# Patient Record
Sex: Male | Born: 1935 | Race: Black or African American | Hispanic: No | Smoking: Never smoker
Health system: Southern US, Community
[De-identification: ages and names within clinical notes are randomized; demographics above are authoritative.]

## PROBLEM LIST (undated history)

## (undated) DIAGNOSIS — I10 Essential (primary) hypertension: Secondary | ICD-10-CM

## (undated) DIAGNOSIS — E119 Type 2 diabetes mellitus without complications: Secondary | ICD-10-CM

## (undated) DIAGNOSIS — F0390 Unspecified dementia without behavioral disturbance: Secondary | ICD-10-CM

## (undated) HISTORY — PX: TOE AMPUTATION: SHX809

---

## 2015-09-03 ENCOUNTER — Emergency Department (HOSPITAL_BASED_OUTPATIENT_CLINIC_OR_DEPARTMENT_OTHER): Payer: Medicare Other

## 2015-09-03 ENCOUNTER — Encounter (HOSPITAL_BASED_OUTPATIENT_CLINIC_OR_DEPARTMENT_OTHER): Payer: Self-pay | Admitting: *Deleted

## 2015-09-03 ENCOUNTER — Inpatient Hospital Stay (HOSPITAL_BASED_OUTPATIENT_CLINIC_OR_DEPARTMENT_OTHER)
Admission: EM | Admit: 2015-09-03 | Discharge: 2015-09-07 | DRG: 683 | Disposition: A | Discharge status: Home / Self Care | Payer: Medicare Other | Attending: Internal Medicine | Admitting: Internal Medicine

## 2015-09-03 ENCOUNTER — Inpatient Hospital Stay (HOSPITAL_COMMUNITY): Payer: Medicare Other

## 2015-09-03 DIAGNOSIS — N3000 Acute cystitis without hematuria: Secondary | ICD-10-CM

## 2015-09-03 DIAGNOSIS — R748 Abnormal levels of other serum enzymes: Secondary | ICD-10-CM | POA: Diagnosis present

## 2015-09-03 DIAGNOSIS — F0390 Unspecified dementia without behavioral disturbance: Secondary | ICD-10-CM | POA: Diagnosis present

## 2015-09-03 DIAGNOSIS — E860 Dehydration: Secondary | ICD-10-CM | POA: Diagnosis present

## 2015-09-03 DIAGNOSIS — E1122 Type 2 diabetes mellitus with diabetic chronic kidney disease: Secondary | ICD-10-CM | POA: Diagnosis present

## 2015-09-03 DIAGNOSIS — F0391 Unspecified dementia with behavioral disturbance: Secondary | ICD-10-CM | POA: Diagnosis not present

## 2015-09-03 DIAGNOSIS — Z79899 Other long term (current) drug therapy: Secondary | ICD-10-CM

## 2015-09-03 DIAGNOSIS — Z794 Long term (current) use of insulin: Secondary | ICD-10-CM | POA: Diagnosis not present

## 2015-09-03 DIAGNOSIS — N390 Urinary tract infection, site not specified: Secondary | ICD-10-CM | POA: Diagnosis present

## 2015-09-03 DIAGNOSIS — N189 Chronic kidney disease, unspecified: Secondary | ICD-10-CM

## 2015-09-03 DIAGNOSIS — I129 Hypertensive chronic kidney disease with stage 1 through stage 4 chronic kidney disease, or unspecified chronic kidney disease: Secondary | ICD-10-CM | POA: Diagnosis present

## 2015-09-03 DIAGNOSIS — E119 Type 2 diabetes mellitus without complications: Secondary | ICD-10-CM

## 2015-09-03 DIAGNOSIS — N183 Chronic kidney disease, stage 3 unspecified: Secondary | ICD-10-CM | POA: Diagnosis present

## 2015-09-03 DIAGNOSIS — K859 Acute pancreatitis, unspecified: Secondary | ICD-10-CM

## 2015-09-03 DIAGNOSIS — I959 Hypotension, unspecified: Secondary | ICD-10-CM | POA: Diagnosis present

## 2015-09-03 DIAGNOSIS — N179 Acute kidney failure, unspecified: Principal | ICD-10-CM | POA: Diagnosis present

## 2015-09-03 DIAGNOSIS — I10 Essential (primary) hypertension: Secondary | ICD-10-CM | POA: Diagnosis present

## 2015-09-03 DIAGNOSIS — Z89429 Acquired absence of other toe(s), unspecified side: Secondary | ICD-10-CM | POA: Diagnosis not present

## 2015-09-03 HISTORY — DX: Essential (primary) hypertension: I10

## 2015-09-03 HISTORY — DX: Unspecified dementia, unspecified severity, without behavioral disturbance, psychotic disturbance, mood disturbance, and anxiety: F03.90

## 2015-09-03 HISTORY — DX: Type 2 diabetes mellitus without complications: E11.9

## 2015-09-03 LAB — URINALYSIS, ROUTINE W REFLEX MICROSCOPIC
Glucose, UA: NEGATIVE mg/dL
Ketones, ur: NEGATIVE mg/dL
Nitrite: NEGATIVE
PH: 5 (ref 5.0–8.0)
Protein, ur: NEGATIVE mg/dL
SPECIFIC GRAVITY, URINE: 1.021 (ref 1.005–1.030)

## 2015-09-03 LAB — COMPREHENSIVE METABOLIC PANEL
ALT: 21 U/L (ref 17–63)
AST: 31 U/L (ref 15–41)
Albumin: 3.7 g/dL (ref 3.5–5.0)
Alkaline Phosphatase: 74 U/L (ref 38–126)
Anion gap: 11 (ref 5–15)
BUN: 39 mg/dL — ABNORMAL HIGH (ref 6–20)
CHLORIDE: 101 mmol/L (ref 101–111)
CO2: 23 mmol/L (ref 22–32)
CREATININE: 3.13 mg/dL — AB (ref 0.61–1.24)
Calcium: 9.5 mg/dL (ref 8.9–10.3)
GFR calc non Af Amer: 16 mL/min — ABNORMAL LOW (ref >60)
GFR, EST AFRICAN AMERICAN: 19 mL/min — AB (ref >60)
Glucose, Bld: 261 mg/dL — ABNORMAL HIGH (ref 65–99)
Potassium: 3.5 mmol/L (ref 3.5–5.1)
SODIUM: 135 mmol/L (ref 135–145)
Total Bilirubin: 0.5 mg/dL (ref 0.3–1.2)
Total Protein: 7.4 g/dL (ref 6.5–8.1)

## 2015-09-03 LAB — I-STAT CG4 LACTIC ACID, ED
LACTIC ACID, VENOUS: 3.65 mmol/L — AB (ref 0.5–2.0)
Lactic Acid, Venous: 1.22 mmol/L (ref 0.5–2.0)

## 2015-09-03 LAB — CBC WITH DIFFERENTIAL/PLATELET
BASOS ABS: 0.1 10*3/uL (ref 0.0–0.1)
BASOS PCT: 1 %
EOS ABS: 0.2 10*3/uL (ref 0.0–0.7)
EOS PCT: 1 %
HCT: 41.4 % (ref 39.0–52.0)
Hemoglobin: 13.6 g/dL (ref 13.0–17.0)
Lymphocytes Relative: 19 %
Lymphs Abs: 2.2 10*3/uL (ref 0.7–4.0)
MCH: 29.4 pg (ref 26.0–34.0)
MCHC: 32.9 g/dL (ref 30.0–36.0)
MCV: 89.6 fL (ref 78.0–100.0)
Monocytes Absolute: 1 10*3/uL (ref 0.1–1.0)
Monocytes Relative: 9 %
Neutro Abs: 8 10*3/uL — ABNORMAL HIGH (ref 1.7–7.7)
Neutrophils Relative %: 70 %
PLATELETS: 208 10*3/uL (ref 150–400)
RBC: 4.62 MIL/uL (ref 4.22–5.81)
RDW: 13.3 % (ref 11.5–15.5)
WBC: 11.4 10*3/uL — AB (ref 4.0–10.5)

## 2015-09-03 LAB — OCCULT BLOOD X 1 CARD TO LAB, STOOL: Fecal Occult Bld: NEGATIVE

## 2015-09-03 LAB — URINE MICROSCOPIC-ADD ON

## 2015-09-03 LAB — LIPASE, BLOOD: Lipase: 198 U/L — ABNORMAL HIGH (ref 11–51)

## 2015-09-03 LAB — TROPONIN I
Troponin I: 0.11 ng/mL — ABNORMAL HIGH (ref <0.031)
Troponin I: 0.12 ng/mL — ABNORMAL HIGH (ref <0.031)

## 2015-09-03 LAB — CBG MONITORING, ED: Glucose-Capillary: 268 mg/dL — ABNORMAL HIGH (ref 65–99)

## 2015-09-03 LAB — BRAIN NATRIURETIC PEPTIDE: B Natriuretic Peptide: 32.9 pg/mL (ref 0.0–100.0)

## 2015-09-03 LAB — LACTATE DEHYDROGENASE: LDH: 193 U/L — AB (ref 98–192)

## 2015-09-03 MED ORDER — SODIUM CHLORIDE 0.9 % IV BOLUS (SEPSIS): 1000.0000 mL | Freq: Once | INTRAVENOUS | Status: DC

## 2015-09-03 MED ORDER — MAGNESIUM OXIDE 400 MG PO TABS: 400.0000 mg | ORAL_TABLET | Freq: Every day | ORAL | Status: DC

## 2015-09-03 MED ORDER — VANCOMYCIN HCL 10 G IV SOLR
20.0000 mg/kg | Freq: Once | INTRAVENOUS | Status: DC
  Filled 2015-09-03: qty 2576

## 2015-09-03 MED ORDER — GALANTAMINE HYDROBROMIDE ER 8 MG PO CP24: 16.0000 mg | ORAL_CAPSULE | Freq: Every day | ORAL | Status: DC

## 2015-09-03 MED ORDER — HEPARIN SODIUM (PORCINE) 5000 UNIT/ML IJ SOLN
5000.0000 [IU] | Freq: Three times a day (TID) | INTRAMUSCULAR | Status: DC
  Administered 2015-09-03 – 2015-09-07 (×11): 5000 [IU] via SUBCUTANEOUS
  Filled 2015-09-03 (×11): qty 1

## 2015-09-03 MED ORDER — VANCOMYCIN HCL 500 MG IV SOLR
INTRAVENOUS | Status: AC
  Filled 2015-09-03: qty 2000

## 2015-09-03 MED ORDER — INSULIN ASPART 100 UNIT/ML ~~LOC~~ SOLN
0.0000 [IU] | SUBCUTANEOUS | Status: DC
  Administered 2015-09-04: 2 [IU] via SUBCUTANEOUS
  Administered 2015-09-04: 3 [IU] via SUBCUTANEOUS
  Administered 2015-09-04: 2 [IU] via SUBCUTANEOUS

## 2015-09-03 MED ORDER — GALANTAMINE HYDROBROMIDE ER 8 MG PO CP24
16.0000 mg | ORAL_CAPSULE | Freq: Every day | ORAL | Status: DC
  Administered 2015-09-04 – 2015-09-07 (×4): 16 mg via ORAL
  Filled 2015-09-03 (×7): qty 2

## 2015-09-03 MED ORDER — MAGNESIUM OXIDE 400 (241.3 MG) MG PO TABS
400.0000 mg | ORAL_TABLET | Freq: Every day | ORAL | Status: DC
  Administered 2015-09-04 – 2015-09-07 (×4): 400 mg via ORAL
  Filled 2015-09-03 (×4): qty 1

## 2015-09-03 MED ORDER — SODIUM CHLORIDE 0.9 % IV BOLUS (SEPSIS)
1000.0000 mL | Freq: Once | INTRAVENOUS | Status: AC
  Administered 2015-09-03: 1000 mL via INTRAVENOUS

## 2015-09-03 MED ORDER — HYDRALAZINE HCL 20 MG/ML IJ SOLN: 10.0000 mg | INTRAMUSCULAR | Status: DC | PRN

## 2015-09-03 MED ORDER — INSULIN GLARGINE 100 UNIT/ML ~~LOC~~ SOLN
45.0000 [IU] | Freq: Every day | SUBCUTANEOUS | Status: DC
  Administered 2015-09-03 – 2015-09-06 (×4): 45 [IU] via SUBCUTANEOUS
  Filled 2015-09-03 (×5): qty 0.45

## 2015-09-03 MED ORDER — BENZTROPINE MESYLATE 1 MG PO TABS
1.0000 mg | ORAL_TABLET | Freq: Two times a day (BID) | ORAL | Status: DC
  Administered 2015-09-03 – 2015-09-07 (×8): 1 mg via ORAL
  Filled 2015-09-03 (×11): qty 1

## 2015-09-03 MED ORDER — CILOSTAZOL 100 MG PO TABS
100.0000 mg | ORAL_TABLET | Freq: Two times a day (BID) | ORAL | Status: DC
  Administered 2015-09-03 – 2015-09-07 (×8): 100 mg via ORAL
  Filled 2015-09-03 (×10): qty 1

## 2015-09-03 MED ORDER — HALOPERIDOL 0.5 MG PO TABS
0.5000 mg | ORAL_TABLET | Freq: Every evening | ORAL | Status: DC | PRN
  Administered 2015-09-04: 0.5 mg via ORAL
  Filled 2015-09-03 (×2): qty 1

## 2015-09-03 MED ORDER — PIPERACILLIN-TAZOBACTAM 3.375 G IVPB 30 MIN
3.3750 g | Freq: Once | INTRAVENOUS | Status: AC
  Administered 2015-09-03: 3.375 g via INTRAVENOUS
  Filled 2015-09-03 (×2): qty 50

## 2015-09-03 MED ORDER — SODIUM CHLORIDE 0.9 % IV SOLN
INTRAVENOUS | Status: DC
  Administered 2015-09-03 – 2015-09-06 (×2): via INTRAVENOUS

## 2015-09-03 MED ORDER — PANTOPRAZOLE SODIUM 40 MG IV SOLR
40.0000 mg | Freq: Once | INTRAVENOUS | Status: AC
  Administered 2015-09-03: 40 mg via INTRAVENOUS
  Filled 2015-09-03: qty 40

## 2015-09-03 MED ORDER — CITALOPRAM HYDROBROMIDE 20 MG PO TABS
20.0000 mg | ORAL_TABLET | Freq: Every day | ORAL | Status: DC
  Administered 2015-09-04 – 2015-09-07 (×4): 20 mg via ORAL
  Filled 2015-09-03 (×4): qty 1

## 2015-09-03 MED ORDER — SODIUM CHLORIDE 0.9 % IJ SOLN
3.0000 mL | Freq: Two times a day (BID) | INTRAMUSCULAR | Status: DC
  Administered 2015-09-03 – 2015-09-06 (×7): 3 mL via INTRAVENOUS

## 2015-09-03 MED ORDER — SODIUM CHLORIDE 0.9 % IV SOLN
2000.0000 mg | Freq: Once | INTRAVENOUS | Status: AC
  Administered 2015-09-03: 2000 mg via INTRAVENOUS
  Filled 2015-09-03: qty 2000

## 2015-09-03 MED ORDER — DEXTROSE 5 % IV SOLN
1.0000 g | INTRAVENOUS | Status: DC
  Administered 2015-09-03 – 2015-09-06 (×4): 1 g via INTRAVENOUS
  Filled 2015-09-03 (×6): qty 10

## 2015-09-03 MED ORDER — METOPROLOL TARTRATE 50 MG PO TABS
50.0000 mg | ORAL_TABLET | Freq: Two times a day (BID) | ORAL | Status: DC
  Administered 2015-09-03 – 2015-09-07 (×7): 50 mg via ORAL
  Filled 2015-09-03 (×8): qty 1

## 2015-09-03 MED ORDER — SODIUM CHLORIDE 0.45 % IV SOLN: INTRAVENOUS | Status: DC

## 2015-09-03 NOTE — Progress Notes (Signed)
[facility], presented with weakness, found to be dehydrated with renal failure, possible pancreatitis and a troponin leak. Per prelim discussions ED MD with family appears to be full code. Initially hypotensive with systolic into the 60s, responded to 1L bolus. Questionable melena but fecal occult negative  On patient's arrival, RN, please call [phone] and inform patient placement RN who will contact the next hospitalist on call who will do the admission.   Brian Camacho M. Elvera LennoxGherghe, MD [facility] [phone]

## 2015-09-03 NOTE — ED Notes (Signed)
Weakness and altered mental status. Dark stools. Not able to eat. Symptoms since Tuesday.

## 2015-09-03 NOTE — ED Notes (Signed)
Dr. Adela LankFloyd notified of pt's VS and to bedside to eval

## 2015-09-03 NOTE — ED Provider Notes (Signed)
CSN: [id number]     Arrival date & time 09/03/15  1435 History   First MD Initiated Contact with Patient 09/03/15 1501     Chief Complaint  Patient presents with  . Altered Mental Status  . Weakness     (Consider location/radiation/quality/duration/timing/severity/associated sxs/prior Treatment) Patient is a 81 y.o. male presenting with altered mental status and weakness.  Altered Mental Status Presenting symptoms: no confusion   Severity:  Mild Most recent episode:  2 days ago Episode history:  Continuous Duration:  4 days Timing:  Constant Progression:  Worsening Chronicity:  New Associated symptoms: nausea and weakness   Associated symptoms: no abdominal pain, no fever, no headaches, no palpitations, no rash and no vomiting   Weakness Pertinent negatives include no chest pain, no abdominal pain, no headaches and no shortness of breath.    81 yo M with a chief complaint of feeling weak. This been going on for about 4 days. Family is in is that he has had some dark and tarry stools with this. Feel like he's been mildly confused been having staring spells and slow to respond to conversation. Today patient was unable to get up to a standing position from a chair. Patient is normally able to transfer without significant difficulty. The normally does need help for this. Patient has been oriented to slow to respond. Received a flu shot about a week ago. Family denies any head injuries denies history of blood thinner use. Denies any abdominal pain diarrhea vomiting. Decreased oral intake for the past 4 days as well. Denies low back pain. Denies dysuria flank pain.  Past Medical History  Diagnosis Date  . Dementia   . Hypertension   . Diabetes mellitus without complication [facility](HCC)    Past Surgical History  Procedure Laterality Date  . Toe amputation     No family history on file. Social History  Substance Use Topics  . Smoking status: Never Smoker   . Smokeless tobacco: None  .  Alcohol Use: No    Review of Systems  Constitutional: Negative for fever and chills.  HENT: Negative for congestion and facial swelling.   Eyes: Negative for discharge and visual disturbance.  Respiratory: Negative for shortness of breath.   Cardiovascular: Negative for chest pain and palpitations.  Gastrointestinal: Positive for nausea, blood in stool (dark and tarry) and abdominal distention. Negative for vomiting, abdominal pain and diarrhea.  Musculoskeletal: Negative for myalgias and arthralgias.  Skin: Negative for color change and rash.  Neurological: Positive for weakness. Negative for tremors, syncope and headaches.  Psychiatric/Behavioral: Negative for confusion and dysphoric mood.      Allergies  Review of patient's allergies indicates no known allergies.  Home Medications   Prior to Admission medications   Medication Sig Start Date End Date Taking? Authorizing Provider  benztropine (COGENTIN) 1 MG tablet Take 1 mg by mouth 2 (two) times daily.   Yes Historical Provider, MD  cilostazol (PLETAL) 100 MG tablet Take 100 mg by mouth 2 (two) times daily.   Yes Historical Provider, MD  citalopram (CELEXA) 20 MG tablet Take 20 mg by mouth daily.   Yes Historical Provider, MD  galantamine (RAZADYNE ER) 16 MG 24 hr capsule Take 16 mg by mouth daily with breakfast.   Yes Historical Provider, MD  GLIPIZIDE PO Take by mouth.   Yes Historical Provider, MD  HYDROCHLOROTHIAZIDE PO Take by mouth.   Yes Historical Provider, MD  insulin glargine (LANTUS) 100 UNIT/ML injection Inject into the skin at bedtime.  Yes Historical Provider, MD  magnesium oxide (MAG-OX) 400 MG tablet Take 400 mg by mouth daily.   Yes Historical Provider, MD  metoprolol (LOPRESSOR) 50 MG tablet Take 50 mg by mouth 2 (two) times daily.   Yes Historical Provider, MD  Multiple Vitamins-Minerals (MULTIVITAMIN ADULT PO) Take by mouth.   Yes Historical Provider, MD  sitaGLIPtin (JANUVIA) 50 MG tablet Take 50 mg by  mouth daily.   Yes Historical Provider, MD  valsartan (DIOVAN) 80 MG tablet Take 80 mg by mouth daily.   Yes Historical Provider, MD   BP 130/85 mmHg  Pulse 79  Temp(Src) 99.3 F (37.4 C) (Rectal)  Resp 19  Ht  (1.88 m)  Wt 284 lb (128.822 kg)  BMI 36.45 kg/m2  SpO2 95% Physical Exam  Constitutional: He is oriented to person, place, and time. He appears well-developed and well-nourished.  HENT:  Head: Normocephalic and atraumatic.  Eyes: EOM are normal. Pupils are equal, round, and reactive to light.  Neck: Normal range of motion. Neck supple. No JVD present.  Cardiovascular: Normal rate and regular rhythm.  Exam reveals no gallop and no friction rub.   No murmur heard. Pulmonary/Chest: No respiratory distress. He has no wheezes.  Abdominal: He exhibits distension. There is no tenderness. There is no rebound and no guarding.  Tympany on percussion  Musculoskeletal: Normal range of motion.  Chronic skin changes to the legs suggesting peripheral vascular disease  4/5 muscle strength to the bilateral lower extremities. Equal.  Neurological: He is alert and oriented to person, place, and time.  Skin: No rash noted. No pallor.  Psychiatric: He has a normal mood and affect. His behavior is normal.  Nursing note and vitals reviewed.   ED Course  Procedures (including critical care time) Labs Review Labs Reviewed  CBC WITH DIFFERENTIAL/PLATELET - Abnormal; Notable for the following:    WBC 11.4 (*)    Neutro Abs 8.0 (*)    All other components within normal limits  COMPREHENSIVE METABOLIC PANEL - Abnormal; Notable for the following:    Glucose, Bld 261 (*)    BUN 39 (*)    Creatinine, Ser 3.13 (*)    GFR calc non Af Amer 16 (*)    GFR calc Af Amer 19 (*)    All other components within normal limits  TROPONIN I - Abnormal; Notable for the following:    Troponin I 0.11 (*)    All other components within normal limits  LIPASE, BLOOD - Abnormal; Notable for the following:     Lipase 198 (*)    All other components within normal limits  CBG MONITORING, ED - Abnormal; Notable for the following:    Glucose-Capillary 268 (*)    All other components within normal limits  I-STAT CG4 LACTIC ACID, ED - Abnormal; Notable for the following:    Lactic Acid, Venous 3.65 (*)    All other components within normal limits  CULTURE, BLOOD (ROUTINE X 2)  CULTURE, BLOOD (ROUTINE X 2)  OCCULT BLOOD X 1 CARD TO LAB, STOOL  BRAIN NATRIURETIC PEPTIDE  URINALYSIS, ROUTINE W REFLEX MICROSCOPIC (NOT AT [facility])  LACTATE DEHYDROGENASE    Imaging Review Ct Abdomen Pelvis Wo Contrast  09/03/2015  CLINICAL DATA:  Altered mental status since Tuesday. Failure to thrive, lethargic, weakness. Hx dementia, HTN, DM Pancreatitis, abd distention. EXAM: CT ABDOMEN AND PELVIS WITHOUT CONTRAST TECHNIQUE: Multidetector CT imaging of the abdomen and pelvis was performed following the standard protocol without IV contrast. COMPARISON:  05/27/2003 FINDINGS: Lower chest: There is bibasilar atelectasis at the lung bases. Coronary artery disease is present. Upper abdomen: No focal abnormality identified within the liver, spleen, pancreas, adrenal glands. Bilateral renal cysts are present. No hydronephrosis or ureteral obstruction. The gallbladder is present. Gastrointestinal tract: The stomach and small bowel loops are normal in appearance. The appendix is well seen and has a normal appearance. Colonic loops have numerous diverticula but there is no evidence for acute diverticulitis. Pelvis: Urinary bladder, prostate glands have a normal appearance. No free pelvic fluid. Retroperitoneum: Atherosclerotic calcification of the abdominal aorta. No aneurysm. No retroperitoneal or mesenteric adenopathy. Abdominal wall: Unremarkable. Osseous structures: Scoliosis. Degenerative changes in the thoracolumbar spine. No suspicious lytic or blastic lesions are identified. IMPRESSION: 1.  No evidence for acute  abnormality. 2.  Coronary artery disease. 3. Bibasilar atelectasis. 4. Bilateral renal cysts. 5. Colonic diverticulosis. 6. Normal appendix. 7. Atherosclerosis of the abdominal aorta. 8. Scoliosis and degenerative disc disease. Electronically Signed   By: Norva Pavlov M.D.   On: 09/03/2015 17:05   Ct Head Wo Contrast  09/03/2015  CLINICAL DATA:  Altered mental status. Failure to thrive. Lethargy. Weakness. Dementia. EXAM: CT HEAD WITHOUT CONTRAST TECHNIQUE: Contiguous axial images were obtained from the base of the skull through the vertex without intravenous contrast. COMPARISON:  08/14/2005 head CT. FINDINGS: The study is mildly motion degraded No evidence of parenchymal hemorrhage or extra-axial fluid collection. Mild prominence of the extra-axial space in the bifrontal convexities is favored to be secondary to cerebral atrophy. No mass lesion, mass effect, or midline shift. No CT evidence of acute infarction. Intracranial atherosclerosis Diffuse cerebral volume loss. Cerebral ventricle sizes are stable and concordant with the degree of cerebral volume loss. 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 The visualized paranasal sinuses are otherwise essentially clear. The mastoid air cells are unopacified. No evidence of calvarial fracture. IMPRESSION: 1. Mildly motion degraded study. No evidence of acute intracranial abnormality. 2. Intracranial atherosclerosis and diffuse cerebral volume loss. Electronically Signed   By: Delbert Phenix M.D.   On: 09/03/2015 17:00   Dg Chest Port 1 View  09/03/2015  CLINICAL DATA:  Cough, slurred speech, lethargic since Tuesday. EXAM: PORTABLE CHEST 1 VIEW COMPARISON:  Previous exams including chest x-ray dated 03/16/2015. FINDINGS: Study is hypoinspiratory. Given the low lung volumes, lungs appear clear. No evidence of pneumonia or pleural effusion. Cardiomediastinal silhouette appears stable in size and configuration. Osseous and soft tissue structures about the chest are unremarkable.  IMPRESSION: No evidence of acute cardiopulmonary abnormality. Electronically Signed   By: Bary Richard M.D.   On: 09/03/2015 15:49   I have personally reviewed and evaluated these images and lab results as part of my medical decision-making.   EKG Interpretation   Date/Time:  Friday September 03 2015 14:52:44 EST Ventricular Rate:  81 PR Interval:  138 QRS Duration: 118 QT Interval:  414 QTC Calculation: 480 R Axis:   -54 Text Interpretation:  Normal sinus rhythm Left axis deviation Left  ventricular hypertrophy with QRS widening ST \\T \ T wave abnormality,  consider lateral ischemia No prior EKG for comparison Confirmed by LIU MD,  Annabelle Harman ([id number]) on 09/03/2015 3:04:29 PM      MDM   Final diagnoses:  Acute pancreatitis, unspecified pancreatitis type    81 yo M with a chief complaints of weakness. This been going on for the past 4 days. No noted infectious complaints. Some nausea concern for dark and tarry stools. Patient with a heart rate of  90 while on beta blockers as well as having of blood pressure in the 60s on my exam. Concern with dark tarry stools of possibility of acute  GI bleed. Rectal exam performed with greenish stool no noted dark and tarry no active bleeding. No noted abdominal tenderness. Initial lactate 3.6. Will obtain a CT of the head CT abdomen pelvis chest x-ray urine CBC CMP lipase troponin. Initially give the patient liter of fluids and Protonix.  Patient had significant improvement of blood pressure was just 1 L of fluids. Blood pressure now in the 130s systolic with maps in the 90s. Patient's lipase concerning at 198. Suspect that patient's symptoms are all related to pancreatitis. CT of the abdomen and pelvis performed without contrast due to acute kidney injury. Patient was found to have no significant intra-abdominal pathology on that. CT the head was negative as well. His troponin was positive at 0.11. Patient with no EKG changes. Not currently having chest pain  or shortness of breath. Question whether this is demand ischemia secondary to hypovolemia. LDH added on for Ranson's criteria.  CRITICAL CARE Performed by: Rae Roam   Total critical care time: 75 minutes  Critical care time was exclusive of separately billable procedures and treating other patients.  Critical care was necessary to treat or prevent imminent or life-threatening deterioration.  Critical care was time spent personally by me on the following activities: development of treatment plan with patient and/or surrogate as well as nursing, discussions with consultants, evaluation of patient's response to treatment, examination of patient, obtaining history from patient or surrogate, ordering and performing treatments and interventions, ordering and review of laboratory studies, ordering and review of radiographic studies, pulse oximetry and re-evaluation of patient's condition.  The patients results and plan were reviewed and discussed.   Any x-rays performed were independently reviewed by myself.   Differential diagnosis were considered with the presenting HPI.  Medications  vancomycin (VANCOCIN) 2,000 mg in sodium chloride 0.9 % 500 mL IVPB (2,000 mg Intravenous New Bag/Given 09/03/15 1618)  0.45 % sodium chloride infusion (not administered)  sodium chloride 0.9 % bolus 1,000 mL (0 mLs Intravenous Stopped 09/03/15 1612)  pantoprazole (PROTONIX) injection 40 mg (40 mg Intravenous Given 09/03/15 1621)  piperacillin-tazobactam (ZOSYN) IVPB 3.375 g (3.375 g Intravenous New Bag/Given 09/03/15 1528)  vancomycin (VANCOCIN) 500 MG powder (  Duplicate 09/03/15 1655)    Filed Vitals:   09/03/15 1516 09/03/15 1530 09/03/15 1600 09/03/15 1700  BP: 81/60 111/71 124/78 130/85  Pulse: 73  77 79  Temp:      TempSrc:      Resp: 19 18 21 19   Height:      Weight:      SpO2: 93%  97% 95%    Final diagnoses:  Acute pancreatitis, unspecified pancreatitis type    Admission/  observation were discussed with the admitting physician, patient and/or family and they are comfortable with the plan.     Melene Plan, DO 09/03/15 [phone]

## 2015-09-03 NOTE — ED Notes (Signed)
Patient transported to CT and returned at this time 

## 2015-09-03 NOTE — H&P (Signed)
[facility] History and Physical  Edenilson Austad WNU:[id number] DOB: April 23, 1923 DOA: 09/03/2015  Referring physician: EDP PCP: Pcp Not In System   Chief Complaint: Generalized Weakness   HPI: Rocky Gladden is a 81 y.o. male with Dementia, who presents to the ED at [facility] with generalized weakness.  Per daughter this has been ongoing and worsening for the past 4 days.  Patient has also been "pocketing" his food (holding it in his mouth without swallowing) which is new.  Family also feels like he has been more confused, staring spells.  No abd pain, no fever, no chills, no headache, no rash, no vomiting, no diarrhea.  Review of Systems: no pain anywhere, no dysuria, but not clear how useful historical information is from the patient as he dosent seem to remember even being in the ED at [facility] earlier this evening. Systems reviewed.  As above, otherwise negative  Past Medical History  Diagnosis Date  . Dementia   . Hypertension   . Diabetes mellitus without complication [facility])    Past Surgical History  Procedure Laterality Date  . Toe amputation     Social History:  reports that he has never smoked. He does not have any smokeless tobacco history on file. He reports that he does not drink alcohol or use illicit drugs.  No Known Allergies  No family history on file.   Prior to Admission medications   Medication Sig Start Date End Date Taking? Authorizing Provider  benztropine (COGENTIN) 1 MG tablet Take 1 mg by mouth 2 (two) times daily.   Yes Historical Provider, MD  cilostazol (PLETAL) 100 MG tablet Take 100 mg by mouth 2 (two) times daily.   Yes Historical Provider, MD  citalopram (CELEXA) 20 MG tablet Take 20 mg by mouth daily.   Yes Historical Provider, MD  galantamine (RAZADYNE ER) 16 MG 24 hr capsule Take 16 mg by mouth daily with breakfast.   Yes Historical Provider, MD  GLIPIZIDE PO Take by mouth.   Yes Historical Provider, MD  HYDROCHLOROTHIAZIDE PO Take by mouth.   Yes  Historical Provider, MD  insulin glargine (LANTUS) 100 UNIT/ML injection Inject into the skin at bedtime.   Yes Historical Provider, MD  magnesium oxide (MAG-OX) 400 MG tablet Take 400 mg by mouth daily.   Yes Historical Provider, MD  metoprolol (LOPRESSOR) 50 MG tablet Take 50 mg by mouth 2 (two) times daily.   Yes Historical Provider, MD  Multiple Vitamins-Minerals (MULTIVITAMIN ADULT PO) Take by mouth.   Yes Historical Provider, MD  sitaGLIPtin (JANUVIA) 50 MG tablet Take 50 mg by mouth daily.   Yes Historical Provider, MD  valsartan (DIOVAN) 80 MG tablet Take 80 mg by mouth daily.   Yes Historical Provider, MD   Physical Exam: Filed Vitals:   09/03/15 1930 09/03/15 2003  BP: 169/93 163/99  Pulse: 80 90  Temp:  97.6 F (36.4 C)  Resp: 20 21    BP 163/99 mmHg  Pulse 90  Temp(Src) 97.6 F (36.4 C) (Oral)  Resp 21  Ht  (1.88 m)  Wt 128.3 kg (282 lb 13.6 oz)  BMI 36.30 kg/m2  SpO2 92%  General Appearance:    Alert, demented, no distress, appears stated age  Head:    Normocephalic, atraumatic  Eyes:    PERRL, EOMI, sclera non-icteric        Nose:   Nares without drainage or epistaxis. Mucosa, turbinates normal  Throat:   Moist mucous membranes. Oropharynx without erythema or exudate.  Neck:   Supple. No carotid bruits.  No thyromegaly.  No lymphadenopathy.   Back:     No CVA tenderness, no spinal tenderness  Lungs:     Clear to auscultation bilaterally, without wheezes, rhonchi or rales  Chest wall:    No tenderness to palpitation  Heart:    Regular rate and rhythm without murmurs, gallops, rubs  Abdomen:     Soft, non-tender, Distended, normal bowel sounds, no organomegaly  Genitalia:    deferred  Rectal:    deferred  Extremities:   No clubbing, cyanosis or edema.  Pulses:   2+ and symmetric all extremities  Skin:   Skin color, texture, turgor normal, no rashes or lesions  Lymph nodes:   Cervical, supraclavicular, and axillary nodes normal  Neurologic:   CNII-XII  intact. Normal strength, sensation and reflexes      throughout    Labs on Admission:  Basic Metabolic Panel:  Recent Labs Lab 09/03/15 1500  NA 135  K 3.5  CL 101  CO2 23  GLUCOSE 261*  BUN 39*  CREATININE 3.13*  CALCIUM 9.5   Liver Function Tests:  Recent Labs Lab 09/03/15 1500  AST 31  ALT 21  ALKPHOS 74  BILITOT 0.5  PROT 7.4  ALBUMIN 3.7    Recent Labs Lab 09/03/15 1500  LIPASE 198*   No results for input(s): AMMONIA in the last 168 hours. CBC:  Recent Labs Lab 09/03/15 1500  WBC 11.4*  NEUTROABS 8.0*  HGB 13.6  HCT 41.4  MCV 89.6  PLT 208   Cardiac Enzymes:  Recent Labs Lab 09/03/15 1500  TROPONINI 0.11*    BNP (last 3 results) No results for input(s): PROBNP in the last 8760 hours. CBG:  Recent Labs Lab 09/03/15 1453  GLUCAP 268*    Radiological Exams on Admission: Ct Abdomen Pelvis Wo Contrast  09/03/2015  CLINICAL DATA:  Altered mental status since Tuesday. Failure to thrive, lethargic, weakness. Hx dementia, HTN, DM Pancreatitis, abd distention. EXAM: CT ABDOMEN AND PELVIS WITHOUT CONTRAST TECHNIQUE: Multidetector CT imaging of the abdomen and pelvis was performed following the standard protocol without IV contrast. COMPARISON:  05/27/2003 FINDINGS: Lower chest: There is bibasilar atelectasis at the lung bases. Coronary artery disease is present. Upper abdomen: No focal abnormality identified within the liver, spleen, pancreas, adrenal glands. Bilateral renal cysts are present. No hydronephrosis or ureteral obstruction. The gallbladder is present. Gastrointestinal tract: The stomach and small bowel loops are normal in appearance. The appendix is well seen and has a normal appearance. Colonic loops have numerous diverticula but there is no evidence for acute diverticulitis. Pelvis: Urinary bladder, prostate glands have a normal appearance. No free pelvic fluid. Retroperitoneum: Atherosclerotic calcification of the abdominal aorta. No  aneurysm. No retroperitoneal or mesenteric adenopathy. Abdominal wall: Unremarkable. Osseous structures: Scoliosis. Degenerative changes in the thoracolumbar spine. No suspicious lytic or blastic lesions are identified. IMPRESSION: 1.  No evidence for acute  abnormality. 2. Coronary artery disease. 3. Bibasilar atelectasis. 4. Bilateral renal cysts. 5. Colonic diverticulosis. 6. Normal appendix. 7. Atherosclerosis of the abdominal aorta. 8. Scoliosis and degenerative disc disease. Electronically Signed   By: Norva PavlovElizabeth  Brown M.D.   On: 09/03/2015 17:05   Ct Head Wo Contrast  09/03/2015  CLINICAL DATA:  Altered mental status. Failure to thrive. Lethargy. Weakness. Dementia. EXAM: CT HEAD WITHOUT CONTRAST TECHNIQUE: Contiguous axial images were obtained from the base of the skull through the vertex without intravenous contrast. COMPARISON:  08/14/2005 head CT. FINDINGS: The study  is mildly motion degraded No evidence of parenchymal hemorrhage or extra-axial fluid collection. Mild prominence of the extra-axial space in the bifrontal convexities is favored to be secondary to cerebral atrophy. No mass lesion, mass effect, or midline shift. No CT evidence of acute infarction. Intracranial atherosclerosis Diffuse cerebral volume loss. Cerebral ventricle sizes are stable and concordant with the degree of cerebral volume loss. 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 The visualized paranasal sinuses are otherwise essentially clear. The mastoid air cells are unopacified. No evidence of calvarial fracture. IMPRESSION: 1. Mildly motion degraded study. No evidence of acute intracranial abnormality. 2. Intracranial atherosclerosis and diffuse cerebral volume loss. Electronically Signed   By: Delbert Phenix M.D.   On: 09/03/2015 17:00   Dg Chest Port 1 View  09/03/2015  CLINICAL DATA:  Cough, slurred speech, lethargic since Tuesday. EXAM: PORTABLE CHEST 1 VIEW COMPARISON:  Previous exams including chest x-ray dated  03/16/2015. FINDINGS: Study is hypoinspiratory. Given the low lung volumes, lungs appear clear. No evidence of pneumonia or pleural effusion. Cardiomediastinal silhouette appears stable in size and configuration. Osseous and soft tissue structures about the chest are unremarkable. IMPRESSION: No evidence of acute cardiopulmonary abnormality. Electronically Signed   By: Bary Richard M.D.   On: 09/03/2015 15:49    EKG: Independently reviewed.  Assessment/Plan Principal Problem:   Acute kidney injury superimposed on chronic kidney disease (HCC) Active Problems:   Elevated lipase   CKD (chronic kidney disease) stage 3, GFR 30-59 ml/min   UTI (urinary tract infection)   DM2 (diabetes mellitus, type 2) (HCC)   HTN (hypertension)   Dementia   1. AKI on CKD stage 3 - 1. Creatinine was 1.6 in June at [facility] this year, now up to 3 2. Bladder scan ordered to r/o retention 3. US renal ordered 4. Dosent really have a pre-renal pattern per say, but certianly has a pre-renal history of decreased PO intake, also had lactate of 3 that cleared to 1 with IVF. 5. Continue IVF for hydration 6. Hold ARB 7. Hold diuretic 2. Elevated lipase - 1. Mild elevation to 190 in patient with AKI 2. No abd pain or TTP 3. No CT scan findings for pancreatitis 4. On IVF as above, but doubt that this represents acute pancreatitis 3. UTI - 1. Culture pending, possible UTI with large leukocyte esterase and 6-30 WBC in urine 2. Empiric rocephin 4. DM2 - 1. Reduce lantus to 50 units QHS from 70 he takes at home while NPO 2. SSI q4h 5. Dementia - with decreased PO intake 1. Concern for swallowing given the pocketing of food in mouth: 2. SLP consult ordered 3. NPO except sips with meds cautiously for now until SLP clears 4. Continue home cogentin, home haldol 0.5mg  QHS prn if needed. 6. HTN - 1. Hold ARB, hold HCTZ 2. Continue metoprolol 3. Adding PRN hydralazine if needed    Code Status: Full code for now per  daughter  Family Communication: No family at bedside Disposition Plan: Admit to inpatient   Time spent: 70 min  GARDNER, JARED M. [facility] Pager [phone]  If 7AM-7PM, please contact the day team taking care of the patient [email or web address] Password [facility] 09/03/2015, 10:49 PM

## 2015-09-04 DIAGNOSIS — N189 Chronic kidney disease, unspecified: Secondary | ICD-10-CM

## 2015-09-04 DIAGNOSIS — F0390 Unspecified dementia without behavioral disturbance: Secondary | ICD-10-CM

## 2015-09-04 DIAGNOSIS — R748 Abnormal levels of other serum enzymes: Secondary | ICD-10-CM

## 2015-09-04 DIAGNOSIS — N179 Acute kidney failure, unspecified: Principal | ICD-10-CM

## 2015-09-04 DIAGNOSIS — N390 Urinary tract infection, site not specified: Secondary | ICD-10-CM

## 2015-09-04 LAB — GLUCOSE, CAPILLARY
GLUCOSE-CAPILLARY: 129 mg/dL — AB (ref 65–99)
Glucose-Capillary: 199 mg/dL — ABNORMAL HIGH (ref 65–99)
Glucose-Capillary: 135 mg/dL — ABNORMAL HIGH (ref 65–99)
Glucose-Capillary: 85 mg/dL (ref 65–99)
Glucose-Capillary: 181 mg/dL — ABNORMAL HIGH (ref 65–99)
Glucose-Capillary: 129 mg/dL — ABNORMAL HIGH (ref 65–99)

## 2015-09-04 LAB — BASIC METABOLIC PANEL
Anion gap: 9 (ref 5–15)
BUN: 31 mg/dL — AB (ref 6–20)
CHLORIDE: 106 mmol/L (ref 101–111)
CO2: 24 mmol/L (ref 22–32)
CREATININE: 2.34 mg/dL — AB (ref 0.61–1.24)
Calcium: 9 mg/dL (ref 8.9–10.3)
GFR calc Af Amer: 27 mL/min — ABNORMAL LOW (ref >60)
GFR calc non Af Amer: 23 mL/min — ABNORMAL LOW (ref >60)
Glucose, Bld: 103 mg/dL — ABNORMAL HIGH (ref 65–99)
Potassium: 3.3 mmol/L — ABNORMAL LOW (ref 3.5–5.1)
Sodium: 139 mmol/L (ref 135–145)

## 2015-09-04 LAB — TROPONIN I
TROPONIN I: 0.11 ng/mL — AB (ref <0.031)
Troponin I: 0.11 ng/mL — ABNORMAL HIGH (ref <0.031)

## 2015-09-04 LAB — MRSA PCR SCREENING: MRSA by PCR: NEGATIVE

## 2015-09-04 MED ORDER — INSULIN ASPART 100 UNIT/ML ~~LOC~~ SOLN
0.0000 [IU] | Freq: Every day | SUBCUTANEOUS | Status: DC
  Administered 2015-09-05: 2 [IU] via SUBCUTANEOUS

## 2015-09-04 MED ORDER — INSULIN ASPART 100 UNIT/ML ~~LOC~~ SOLN: 0.0000 [IU] | Freq: Three times a day (TID) | SUBCUTANEOUS | Status: DC

## 2015-09-04 MED ORDER — INSULIN ASPART 100 UNIT/ML ~~LOC~~ SOLN
0.0000 [IU] | Freq: Three times a day (TID) | SUBCUTANEOUS | Status: DC
  Administered 2015-09-04: 3 [IU] via SUBCUTANEOUS
  Administered 2015-09-05 (×2): 5 [IU] via SUBCUTANEOUS
  Administered 2015-09-06: 3 [IU] via SUBCUTANEOUS
  Administered 2015-09-06: 2 [IU] via SUBCUTANEOUS

## 2015-09-04 NOTE — Progress Notes (Signed)
[facility] Progress Note   Brian Camacho  ZOX:[id number]  DOB: 1923/01/09  DOA: 09/03/2015 PCP: Pcp Not In System  Brief narrative: Brian Camacho is a 81 y.o. male with dementia and recently with poor PO intake at home and pocketing of food per family who presents with generalized weakness and confusion and is found to have AKI.    Subjective: Alert, confused to place. No complaints of nausea, vomiting, dyspnea, cough, abdominal pain or diarrhea.   Assessment/Plan: Principal Problem:   Acute kidney injury superimposed on chronic kidney disease/CKD (chronic kidney disease) stage 3, GFR 30-59 ml/min - suspected to be prerenal  Active Problems:   Elevated lipase - no nausea, vomiting or abdominal pain    UTI (urinary tract infection) - mildly positive UA- blood cultures negative- cont Rocephin    HTN (hypertension) - currently hypotensive- antihypertensives on hold    Dementia - confused to place, time and situation - has passed SLP eval - can have D2 with thin liquids- follow PO intake   Code Status:     Code Status Orders        Start     Ordered   09/03/15 2248  Full code   Continuous     09/03/15 2248    Advance Directive Documentation        Most Recent Value   Type of Advance Directive  Healthcare Power of Attorney, Living will   Pre-existing out of facility DNR order (yellow form or pink MOST form)     "MOST" Form in Place?       Family Communication:   Disposition Plan: follow in SDU DVT prophylaxis: heparin Consultants: Procedures:   Antibiotics: Anti-infectives    Start     Dose/Rate Route Frequency Ordered Stop   09/03/15 2300  cefTRIAXone (ROCEPHIN) 1 g in dextrose 5 % 50 mL IVPB     1 g 100 mL/hr over 30 Minutes Intravenous Every 24 hours 09/03/15 2232     09/03/15 1601  vancomycin (VANCOCIN) 500 MG powder    Comments:  Benton, Joss   : cabinet override      09/03/15 1601 09/03/15 1655   09/03/15 1545  vancomycin (VANCOCIN) 2,000 mg  in sodium chloride 0.9 % 500 mL IVPB     2,000 mg 250 mL/hr over 120 Minutes Intravenous  Once 09/03/15 1543 09/03/15 1901   09/03/15 1530  vancomycin (VANCOCIN) 2,576 mg in sodium chloride 0.9 % 500 mL IVPB  Status:  Discontinued     20 mg/kg  128.8 kg 250 mL/hr over 120 Minutes Intravenous  Once 09/03/15 1515 09/03/15 1543   09/03/15 1530  piperacillin-tazobactam (ZOSYN) IVPB 3.375 g     3.375 g 100 mL/hr over 30 Minutes Intravenous  Once 09/03/15 1515 09/03/15 1702      Objective: Filed Weights   09/03/15 1442 09/03/15 2003  Weight: 128.822 kg (284 lb) 128.3 kg (282 lb 13.6 oz)    Intake/Output Summary (Last 24 hours) at 09/04/15 1516 Last data filed at 09/04/15 1500  Gross per 24 hour  Intake 3562.5 ml  Output    375 ml  Net 3187.5 ml     Vitals Filed Vitals:   09/04/15 0700 09/04/15 1000 09/04/15 1100 09/04/15 1249  BP:  94/64  103/64  Pulse:    79  Temp: 97.7 F (36.5 C)  98.1 F (36.7 C)   TempSrc: Oral  Oral   Resp:    16  Height:      Weight:  SpO2:    93%    Exam:  General:  Pt is alert, not in acute distress  HEENT: No icterus, No thrush, oral mucosa moist  Cardiovascular: regular rate and rhythm, S1/S2 No murmur  Respiratory: clear to auscultation bilaterally   Abdomen: Soft, +Bowel sounds, non tender, non distended, no guarding  MSK: No LE edema, cyanosis or clubbing  Data Reviewed: Basic Metabolic Panel:  Recent Labs Lab 09/03/15 1500 09/04/15 0410  NA 135 139  K 3.5 3.3*  CL 101 106  CO2 23 24  GLUCOSE 261* 103*  BUN 39* 31*  CREATININE 3.13* 2.34*  CALCIUM 9.5 9.0   Liver Function Tests:  Recent Labs Lab 09/03/15 1500  AST 31  ALT 21  ALKPHOS 74  BILITOT 0.5  PROT 7.4  ALBUMIN 3.7    Recent Labs Lab 09/03/15 1500  LIPASE 198*   No results for input(s): AMMONIA in the last 168 hours. CBC:  Recent Labs Lab 09/03/15 1500  WBC 11.4*  NEUTROABS 8.0*  HGB 13.6  HCT 41.4  MCV 89.6  PLT 208   Cardiac  Enzymes:  Recent Labs Lab 09/03/15 1500 09/03/15 2224 09/04/15 0410 09/04/15 0930  TROPONINI 0.11* 0.12* 0.11* 0.11*   BNP (last 3 results)  Recent Labs  09/03/15 1500  BNP 32.9    ProBNP (last 3 results) No results for input(s): PROBNP in the last 8760 hours.  CBG:  Recent Labs Lab 09/03/15 1453 09/04/15 0055 09/04/15 0304 09/04/15 0737  GLUCAP 268* 199* 135* 85    Recent Results (from the past 240 hour(s))  Blood culture (routine x 2)     Status: None (Preliminary result)   Collection Time: 09/03/15  3:00 PM  Result Value Ref Range Status   Specimen Description BLOOD RIGHT ANTECUBITAL  Final   Special Requests BOTTLES DRAWN AEROBIC AND ANAEROBIC 5CC EACH  Final   Culture   Final    NO GROWTH < 24 HOURS Performed at [facility]    Report Status PENDING  Incomplete  Blood culture (routine x 2)     Status: None (Preliminary result)   Collection Time: 09/03/15  3:10 PM  Result Value Ref Range Status   Specimen Description BLOOD RIGHT FOREARM  Final   Special Requests BOTTLES DRAWN AEROBIC ONLY 5CC  Final   Culture   Final    NO GROWTH < 24 HOURS Performed at [facility]    Report Status PENDING  Incomplete  MRSA PCR Screening     Status: None   Collection Time: 09/03/15 10:37 PM  Result Value Ref Range Status   MRSA by PCR NEGATIVE NEGATIVE Final    Comment:        The GeneXpert MRSA Assay (FDA approved for NASAL specimens only), is one component of a comprehensive MRSA colonization surveillance program. It is not intended to diagnose MRSA infection nor to guide or monitor treatment for MRSA infections.      Studies: Ct Abdomen Pelvis Wo Contrast  09/03/2015  CLINICAL DATA:  Altered mental status since Tuesday. Failure to thrive, lethargic, weakness. Hx dementia, HTN, DM Pancreatitis, abd distention. EXAM: CT ABDOMEN AND PELVIS WITHOUT CONTRAST TECHNIQUE: Multidetector CT imaging of the abdomen and pelvis was performed following  the standard protocol without IV contrast. COMPARISON:  05/27/2003 FINDINGS: Lower chest: There is bibasilar atelectasis at the lung bases. Coronary artery disease is present. Upper abdomen: No focal abnormality identified within the liver, spleen, pancreas, adrenal glands. Bilateral renal cysts are present. No  hydronephrosis or ureteral obstruction. The gallbladder is present. Gastrointestinal tract: The stomach and small bowel loops are normal in appearance. The appendix is well seen and has a normal appearance. Colonic loops have numerous diverticula but there is no evidence for acute diverticulitis. Pelvis: Urinary bladder, prostate glands have a normal appearance. No free pelvic fluid. Retroperitoneum: Atherosclerotic calcification of the abdominal aorta. No aneurysm. No retroperitoneal or mesenteric adenopathy. Abdominal wall: Unremarkable. Osseous structures: Scoliosis. Degenerative changes in the thoracolumbar spine. No suspicious lytic or blastic lesions are identified. IMPRESSION: 1.  No evidence for acute  abnormality. 2. Coronary artery disease. 3. Bibasilar atelectasis. 4. Bilateral renal cysts. 5. Colonic diverticulosis. 6. Normal appendix. 7. Atherosclerosis of the abdominal aorta. 8. Scoliosis and degenerative disc disease. Electronically Signed   By: Norva Pavlov M.D.   On: 09/03/2015 17:05   Ct Head Wo Contrast  09/03/2015  CLINICAL DATA:  Altered mental status. Failure to thrive. Lethargy. Weakness. Dementia. EXAM: CT HEAD WITHOUT CONTRAST TECHNIQUE: Contiguous axial images were obtained from the base of the skull through the vertex without intravenous contrast. COMPARISON:  08/14/2005 head CT. FINDINGS: The study is mildly motion degraded No evidence of parenchymal hemorrhage or extra-axial fluid collection. Mild prominence of the extra-axial space in the bifrontal convexities is favored to be secondary to cerebral atrophy. No mass lesion, mass effect, or midline shift. No CT evidence of  acute infarction. Intracranial atherosclerosis Diffuse cerebral volume loss. Cerebral ventricle sizes are stable and concordant with the degree of cerebral volume loss. 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 The visualized paranasal sinuses are otherwise essentially clear. The mastoid air cells are unopacified. No evidence of calvarial fracture. IMPRESSION: 1. Mildly motion degraded study. No evidence of acute intracranial abnormality. 2. Intracranial atherosclerosis and diffuse cerebral volume loss. Electronically Signed   By: Delbert Phenix M.D.   On: 09/03/2015 17:00   US Renal Port  09/04/2015  CLINICAL DATA:  81 year old male with acute renal insufficiency. EXAM: RENAL / URINARY TRACT ULTRASOUND COMPLETE COMPARISON:  CT dated 09/03/2015 FINDINGS: Right Kidney: Length: 10.7 cm. The right kidney is mildly atrophic and echogenic. There is a 1.9 x 2.0 x 1.9 cm interpolar cyst. There is no hydronephrosis or echogenic stone. Left Kidney: Length: 12.9 cm. There is moderate cortical thinning with mild increased echogenicity. No hydronephrosis or echogenic stone. Bladder: Appears normal for degree of bladder distention. IMPRESSION: Right renal cyst. Bilateral renal cortical thinning with mild increased echogenicity. No hydronephrosis or or echogenic stone. Electronically Signed   By: Elgie Collard M.D.   On: 09/04/2015 02:45   Dg Chest Port 1 View  09/03/2015  CLINICAL DATA:  Cough, slurred speech, lethargic since Tuesday. EXAM: PORTABLE CHEST 1 VIEW COMPARISON:  Previous exams including chest x-ray dated 03/16/2015. FINDINGS: Study is hypoinspiratory. Given the low lung volumes, lungs appear clear. No evidence of pneumonia or pleural effusion. Cardiomediastinal silhouette appears stable in size and configuration. Osseous and soft tissue structures about the chest are unremarkable. IMPRESSION: No evidence of acute cardiopulmonary abnormality. Electronically Signed   By: Bary Richard M.D.   On: 09/03/2015  15:49    Scheduled Meds:  Scheduled Meds: . benztropine  1 mg Oral BID  . cefTRIAXone (ROCEPHIN)  IV  1 g Intravenous Q24H  . cilostazol  100 mg Oral BID  . citalopram  20 mg Oral Daily  . galantamine  16 mg Oral Q breakfast  . heparin  5,000 Units Subcutaneous 3 times per day  . insulin aspart  0-15  Units Subcutaneous 6 times per day  . insulin glargine  45 Units Subcutaneous QHS  . magnesium oxide  400 mg Oral Daily  . metoprolol  50 mg Oral BID  . sodium chloride  3 mL Intravenous Q12H   Continuous Infusions: . sodium chloride 125 mL/hr at 09/04/15 1500    Time spent on care of this patient: 35 min   Raye Wiens, MD 09/04/2015, 3:16 PM  LOS: 1 day   [facility] Office  [phone] Pager - Text Page per [email or web address] If 7PM-7AM, please contact night-coverage [email or web address]

## 2015-09-04 NOTE — Evaluation (Signed)
Clinical/Bedside Swallow Evaluation Patient Details  Name: Brian Camacho MRN: [id number] Date of Birth: 1923-04-22  Today's Date: 09/04/2015 Time: SLP Start Time (ACUTE ONLY): 0859 SLP Stop Time (ACUTE ONLY): 0919 SLP Time Calculation (min) (ACUTE ONLY): 20 min  Past Medical History:  Past Medical History  Diagnosis Date  . Dementia   . Hypertension   . Diabetes mellitus without complication [facility](HCC)    Past Surgical History:  Past Surgical History  Procedure Laterality Date  . Toe amputation     HPI:  Brian NoraJames Barabas is a 81 y.o. male with Dementia, who [facility] with generalized weakness. Per daughter this has been ongoing and worsening for the past 4 days. Patient has also been "pocketing" his food (holding it in his mouth without swallowing) which is new. Family also feels like he has been more confused, staring spells. No abd pain, no fever, no chills, no headache, no rash, no vomiting, no diarrhea.   Assessment / Plan / Recommendation Clinical Impression  Clinical swallowing evaluation was completed. Family had noted new onset of oral pocketing of foods.  Oral mech exam did not reveal any overt issues.  Lingula and labial ROM and strength were adequate.  The patient presented with mild oral dysphagia characterized by delayed oral transit that was most likely impacted by his lack of dentition.  Swallow trigger was timely and hyo-laryngeal excursion appeared to be adequate.  No overt s/s of aspiration were noted.  Recommend a dysphagia 2 diet with thin liquids.  ST to follow up for therapeutic diet tolerance.     Aspiration Risk  No limitations    Diet Recommendation   Dysphagia 2 with thin liquids.    Medication Administration: Whole meds with liquid    Other  Recommendations Oral Care Recommendations: Oral care BID   Follow up Recommendations  None    Frequency and Duration min 1 x/week  2 weeks       Prognosis Prognosis for Safe Diet Advancement: Fair       Swallow Study   General Date of Onset: 09/03/15 HPI: Brian NoraJames Yarde is a 81 y.o. male with Dementia, who [facility] with generalized weakness. Per daughter this has been ongoing and worsening for the past 4 days. Patient has also been "pocketing" his food (holding it in his mouth without swallowing) which is new. Family also feels like he has been more confused, staring spells. No abd pain, no fever, no chills, no headache, no rash, no vomiting, no diarrhea. Type of Study: Bedside Swallow Evaluation Previous Swallow Assessment: None noted. Diet Prior to this Study: NPO Temperature Spikes Noted: Yes Respiratory Status: Room air History of Recent Intubation: No Behavior/Cognition: Alert;Cooperative;Pleasant mood;Requires cueing Oral Cavity Assessment: Dry Oral Care Completed by SLP: No Oral Cavity - Dentition: Edentulous Vision: Functional for self-feeding Self-Feeding Abilities: Needs assist Patient Positioning: Upright in bed Baseline Vocal Quality: Normal Volitional Cough: Strong Volitional Swallow: Able to elicit    Oral/Motor/Sensory Function Overall Oral Motor/Sensory Function: Within functional limits   Ice Chips Ice chips: Not tested   Thin Liquid Thin Liquid: Within functional limits Presentation: Cup;Spoon;Straw    Nectar Thick Nectar Thick Liquid: Not tested   Honey Thick Honey Thick Liquid: Not tested   Puree Puree: Impaired Presentation: Spoon Oral Phase Impairments: Reduced lingual movement/coordination Oral Phase Functional Implications: Prolonged oral transit   Solid Solid: Impaired Presentation: Spoon Oral Phase Impairments: Impaired mastication Oral Phase Functional Implications: Prolonged oral transit  Dimas Aguas, MA, CCC-SLP Acute Rehab SLP [phone]  Dimas Aguas N 09/04/2015,9:26 AM

## 2015-09-05 DIAGNOSIS — F0391 Unspecified dementia with behavioral disturbance: Secondary | ICD-10-CM

## 2015-09-05 DIAGNOSIS — I10 Essential (primary) hypertension: Secondary | ICD-10-CM

## 2015-09-05 DIAGNOSIS — E119 Type 2 diabetes mellitus without complications: Secondary | ICD-10-CM

## 2015-09-05 LAB — BASIC METABOLIC PANEL
ANION GAP: 5 (ref 5–15)
BUN: 23 mg/dL — AB (ref 6–20)
CALCIUM: 8.7 mg/dL — AB (ref 8.9–10.3)
CHLORIDE: 109 mmol/L (ref 101–111)
CO2: 24 mmol/L (ref 22–32)
CREATININE: 1.76 mg/dL — AB (ref 0.61–1.24)
GFR calc non Af Amer: 32 mL/min — ABNORMAL LOW (ref >60)
GFR, EST AFRICAN AMERICAN: 37 mL/min — AB (ref >60)
Glucose, Bld: 91 mg/dL (ref 65–99)
Potassium: 4.4 mmol/L (ref 3.5–5.1)
SODIUM: 138 mmol/L (ref 135–145)

## 2015-09-05 LAB — GLUCOSE, CAPILLARY
GLUCOSE-CAPILLARY: 90 mg/dL (ref 65–99)
GLUCOSE-CAPILLARY: 211 mg/dL — AB (ref 65–99)
Glucose-Capillary: 203 mg/dL — ABNORMAL HIGH (ref 65–99)

## 2015-09-05 LAB — URINE CULTURE: Culture: NO GROWTH

## 2015-09-05 MED ORDER — RISPERIDONE 1 MG/ML PO SOLN
1.0000 mg | Freq: Every day | ORAL | Status: DC
  Filled 2015-09-05 (×2): qty 1

## 2015-09-05 MED ORDER — HALOPERIDOL 0.5 MG PO TABS
0.5000 mg | ORAL_TABLET | Freq: Every day | ORAL | Status: DC
  Administered 2015-09-05 – 2015-09-06 (×2): 0.5 mg via ORAL
  Filled 2015-09-05 (×3): qty 1

## 2015-09-05 NOTE — Progress Notes (Signed)
Patient to transfer to 6E16 report given to receiving nurse all questions answered at this time.  Pt. VSS with no s/s of distress noted.  Patient stable at transfer.

## 2015-09-05 NOTE — Progress Notes (Signed)
[facility] Progress Note   Per Beagley  WGN:[id number]  DOB: 14-Dec-1922  DOA: 09/03/2015 PCP: Pcp Not In System  Brief narrative: Brian Camacho is a 81 y.o. male with dementia and recently with poor PO intake at home and pocketing of food per family who presents with generalized weakness and confusion and is found to have AKI.    Subjective: Alert,  RN, he is eating well as long as he is encouraged. Needs to be prompted to drink water and this prevents him from pocketing his food. The patient has no complaints.   Assessment/Plan: Principal Problem:   Acute kidney injury superimposed on chronic kidney disease/CKD (chronic kidney disease) stage 3, GFR 30-59 ml/min - suspected to be prerenal- improving- decrease IVF rate  Active Problems:   Elevated lipase - no nausea, vomiting or abdominal pain    UTI (urinary tract infection) - mildly positive UA- cont Rocephin - blood cultures negative- Urine culture negative thus far    HTN (hypertension) - currently hypotensive- antihypertensives on hold    Dementia - confused to place, time and situation  Pocketing of food - has passed SLP eval - tolerating D2 with thin liquids but needs quite a bit of prompting and encouragement when eating   Code Status:     Code Status Orders        Start     Ordered   09/03/15 2248  Full code   Continuous     09/03/15 2248    Advance Directive Documentation        Most Recent Value   Type of Advance Directive  Healthcare Power of Attorney, Living will   Pre-existing out of facility DNR order (yellow form or pink MOST form)     "MOST" Form in Place?       Family Communication:   Disposition Plan:tx to med surg floor DVT prophylaxis: heparin Consultants: Procedures:   Antibiotics: Anti-infectives    Start     Dose/Rate Route Frequency Ordered Stop   09/03/15 2300  cefTRIAXone (ROCEPHIN) 1 g in dextrose 5 % 50 mL IVPB     1 g 100 mL/hr over 30 Minutes Intravenous Every 24  hours 09/03/15 2232     09/03/15 1601  vancomycin (VANCOCIN) 500 MG powder    Comments:  Benton, Joss   : cabinet override      09/03/15 1601 09/03/15 1655   09/03/15 1545  vancomycin (VANCOCIN) 2,000 mg in sodium chloride 0.9 % 500 mL IVPB     2,000 mg 250 mL/hr over 120 Minutes Intravenous  Once 09/03/15 1543 09/03/15 1901   09/03/15 1530  vancomycin (VANCOCIN) 2,576 mg in sodium chloride 0.9 % 500 mL IVPB  Status:  Discontinued     20 mg/kg  128.8 kg 250 mL/hr over 120 Minutes Intravenous  Once 09/03/15 1515 09/03/15 1543   09/03/15 1530  piperacillin-tazobactam (ZOSYN) IVPB 3.375 g     3.375 g 100 mL/hr over 30 Minutes Intravenous  Once 09/03/15 1515 09/03/15 1702      Objective: Filed Weights   09/03/15 1442 09/03/15 2003  Weight: 128.822 kg (284 lb) 128.3 kg (282 lb 13.6 oz)    Intake/Output Summary (Last 24 hours) at 09/05/15 1550 Last data filed at 09/05/15 1418  Gross per 24 hour  Intake   2780 ml  Output   1176 ml  Net   1604 ml     Vitals Filed Vitals:   09/05/15 0305 09/05/15 0821 09/05/15 1314 09/05/15 1520  BP: 128/77  142/83 127/75 122/65  Pulse: 66 66 66 68  Temp: 98.1 F (36.7 C) 98.3 F (36.8 C) 97.5 F (36.4 C) 97.8 F (36.6 C)  TempSrc: Axillary Oral Oral Oral  Resp: 16 23 19 18   Height:      Weight:      SpO2: 97% 95% 99% 98%    Exam:  General:  Brian Camacho is alert, confused to place and time, not in acute distress  HEENT: No icterus, No thrush, oral mucosa moist  Cardiovascular: regular rate and rhythm, S1/S2 No murmur  Respiratory: clear to auscultation bilaterally   Abdomen: Soft, +Bowel sounds, non tender, non distended, no guarding  MSK: No LE edema, cyanosis or clubbing  Data Reviewed: Basic Metabolic Panel:  Recent Labs Lab 09/03/15 1500 09/04/15 0410 09/05/15 0750  NA 135 139 138  K 3.5 3.3* 4.4  CL 101 106 109  CO2 23 24 24   GLUCOSE 261* 103* 91  BUN 39* 31* 23*  CREATININE 3.13* 2.34* 1.76*  CALCIUM 9.5 9.0 8.7*    Liver Function Tests:  Recent Labs Lab 09/03/15 1500  AST 31  ALT 21  ALKPHOS 74  BILITOT 0.5  PROT 7.4  ALBUMIN 3.7    Recent Labs Lab 09/03/15 1500  LIPASE 198*   No results for input(s): AMMONIA in the last 168 hours. CBC:  Recent Labs Lab 09/03/15 1500  WBC 11.4*  NEUTROABS 8.0*  HGB 13.6  HCT 41.4  MCV 89.6  PLT 208   Cardiac Enzymes:  Recent Labs Lab 09/03/15 1500 09/03/15 2224 09/04/15 0410 09/04/15 0930  TROPONINI 0.11* 0.12* 0.11* 0.11*   BNP (last 3 results)  Recent Labs  09/03/15 1500  BNP 32.9    ProBNP (last 3 results) No results for input(s): PROBNP in the last 8760 hours.  CBG:  Recent Labs Lab 09/04/15 1250 09/04/15 1551 09/04/15 2156 09/05/15 0840 09/05/15 1313  GLUCAP 129* 181* 129* 90 203*    Recent Results (from the past 240 hour(s))  Blood culture (routine x 2)     Status: None (Preliminary result)   Collection Time: 09/03/15  3:00 PM  Result Value Ref Range Status   Specimen Description BLOOD RIGHT ANTECUBITAL  Final   Special Requests BOTTLES DRAWN AEROBIC AND ANAEROBIC 5CC EACH  Final   Culture   Final    NO GROWTH 2 DAYS Performed at [facility]    Report Status PENDING  Incomplete  Blood culture (routine x 2)     Status: None (Preliminary result)   Collection Time: 09/03/15  3:10 PM  Result Value Ref Range Status   Specimen Description BLOOD RIGHT FOREARM  Final   Special Requests BOTTLES DRAWN AEROBIC ONLY 5CC  Final   Culture   Final    NO GROWTH 2 DAYS Performed at [facility]    Report Status PENDING  Incomplete  MRSA PCR Screening     Status: None   Collection Time: 09/03/15 10:37 PM  Result Value Ref Range Status   MRSA by PCR NEGATIVE NEGATIVE Final    Comment:        The GeneXpert MRSA Assay (FDA approved for NASAL specimens only), is one component of a comprehensive MRSA colonization surveillance program. It is not intended to diagnose MRSA infection nor to guide  or monitor treatment for MRSA infections.   Culture, Urine     Status: None   Collection Time: 09/04/15 12:24 AM  Result Value Ref Range Status   Specimen Description URINE, RANDOM  Final   Special Requests NONE  Final   Culture NO GROWTH 1 DAY  Final   Report Status 09/05/2015 FINAL  Final     Studies: Ct Abdomen Pelvis Wo Contrast  09/03/2015  CLINICAL DATA:  Altered mental status since Tuesday. Failure to thrive, lethargic, weakness. Hx dementia, HTN, DM Pancreatitis, abd distention. EXAM: CT ABDOMEN AND PELVIS WITHOUT CONTRAST TECHNIQUE: Multidetector CT imaging of the abdomen and pelvis was performed following the standard protocol without IV contrast. COMPARISON:  05/27/2003 FINDINGS: Lower chest: There is bibasilar atelectasis at the lung bases. Coronary artery disease is present. Upper abdomen: No focal abnormality identified within the liver, spleen, pancreas, adrenal glands. Bilateral renal cysts are present. No hydronephrosis or ureteral obstruction. The gallbladder is present. Gastrointestinal tract: The stomach and small bowel loops are normal in appearance. The appendix is well seen and has a normal appearance. Colonic loops have numerous diverticula but there is no evidence for acute diverticulitis. Pelvis: Urinary bladder, prostate glands have a normal appearance. No free pelvic fluid. Retroperitoneum: Atherosclerotic calcification of the abdominal aorta. No aneurysm. No retroperitoneal or mesenteric adenopathy. Abdominal wall: Unremarkable. Osseous structures: Scoliosis. Degenerative changes in the thoracolumbar spine. No suspicious lytic or blastic lesions are identified. IMPRESSION: 1.  No evidence for acute  abnormality. 2. Coronary artery disease. 3. Bibasilar atelectasis. 4. Bilateral renal cysts. 5. Colonic diverticulosis. 6. Normal appendix. 7. Atherosclerosis of the abdominal aorta. 8. Scoliosis and degenerative disc disease. Electronically Signed   By: Norva PavlovElizabeth  Brown M.D.    On: 09/03/2015 17:05   Ct Head Wo Contrast  09/03/2015  CLINICAL DATA:  Altered mental status. Failure to thrive. Lethargy. Weakness. Dementia. EXAM: CT HEAD WITHOUT CONTRAST TECHNIQUE: Contiguous axial images were obtained from the base of the skull through the vertex without intravenous contrast. COMPARISON:  08/14/2005 head CT. FINDINGS: The study is mildly motion degraded No evidence of parenchymal hemorrhage or extra-axial fluid collection. Mild prominence of the extra-axial space in the bifrontal convexities is favored to be secondary to cerebral atrophy. No mass lesion, mass effect, or midline shift. No CT evidence of acute infarction. Intracranial atherosclerosis Diffuse cerebral volume loss. Cerebral ventricle sizes are stable and concordant with the degree of cerebral volume loss. 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 The visualized paranasal sinuses are otherwise essentially clear. The mastoid air cells are unopacified. No evidence of calvarial fracture. IMPRESSION: 1. Mildly motion degraded study. No evidence of acute intracranial abnormality. 2. Intracranial atherosclerosis and diffuse cerebral volume loss. Electronically Signed   By: Delbert PhenixJason A Poff M.D.   On: 09/03/2015 17:00   [place] Renal Port  09/04/2015  CLINICAL DATA:  81 year old male with acute renal insufficiency. EXAM: RENAL / URINARY TRACT ULTRASOUND COMPLETE COMPARISON:  CT dated 09/03/2015 FINDINGS: Right Kidney: Length: 10.7 cm. The right kidney is mildly atrophic and echogenic. There is a 1.9 x 2.0 x 1.9 cm interpolar cyst. There is no hydronephrosis or echogenic stone. Left Kidney: Length: 12.9 cm. There is moderate cortical thinning with mild increased echogenicity. No hydronephrosis or echogenic stone. Bladder: Appears normal for degree of bladder distention. IMPRESSION: Right renal cyst. Bilateral renal cortical thinning with mild increased echogenicity. No hydronephrosis or or echogenic stone. Electronically Signed   By: Elgie CollardArash   Radparvar M.D.   On: 09/04/2015 02:45    Scheduled Meds:  Scheduled Meds: . benztropine  1 mg Oral BID  . cefTRIAXone (ROCEPHIN)  IV  1 g Intravenous Q24H  . cilostazol  100 mg Oral BID  . citalopram  20 mg Oral Daily  . galantamine  16 mg Oral Q breakfast  . heparin  5,000 Units Subcutaneous 3 times per day  . insulin aspart  0-15 Units Subcutaneous TID WC  . insulin aspart  0-5 Units Subcutaneous QHS  . insulin glargine  45 Units Subcutaneous QHS  . magnesium oxide  400 mg Oral Daily  . metoprolol  50 mg Oral BID  . risperiDONE  1 mg Oral QHS  . sodium chloride  3 mL Intravenous Q12H   Continuous Infusions: . sodium chloride 75 mL/hr at 09/05/15 1100    Time spent on care of this patient: 35 min   Zelda Reames, MD 09/05/2015, 3:50 PM  LOS: 2 days   [facility] Office  [phone] Pager - Text Page per [email or web address] If 7PM-7AM, please contact night-coverage [email or web address]

## 2015-09-05 NOTE — Progress Notes (Signed)
Transfer note:  Arrival Method: Bed from 3S Mental Orientation: A&OX1 Telemetry: 6E16 CCMD notified Assessment: See flowsheet Skin: Dry;intact IV: R A/C; NS 1175ml/hr Pain: Denies Tubes: Condom cath Safety Measures: bed in lowest position, nonskid socks placed, bed alarm activated, placed in camera room near nursing station  6700 Orientation: Patient has been oriented to the unit, staff and to the room.   Orders have been reviewed and implemented. Will continue to assess and monitor pt.  Lujean AmelKadeesha Kenyetta Wimbish,RN

## 2015-09-05 NOTE — Evaluation (Signed)
Physical Therapy Evaluation Patient Details Name: Brian Camacho MRN: [id number] DOB: 14-Aug-1923 Today's Date: 09/05/2015   History of Present Illness  Brian NoraJames Lavis is a 81 y.o. male with Dementia, who [facility].  Dx: AKI.  Pt's PMH includes dementia,   Clinical Impression  Pt admitted with above diagnosis. Pt currently with functional limitations due to the deficits listed below (see PT Problem List). Per RN's discussion w/ pt's daughter he has WC at home and is able to transfer to chair Ind.  Unclear pt's living situation and PLOF.  Pt will need 24/7 supervision/assist at d/c and therefore recommending SNF, unless family is prepared and able to provide 24/7 assist at home.  Pt required minA for lateral transfer from bed to drop arm chair. Pt will benefit from skilled PT to increase their independence and safety with mobility to allow discharge to the venue listed below.      Follow Up Recommendations SNF;Supervision/Assistance - 24 hour    Equipment Recommendations  None recommended by PT    Recommendations for Other Services       Precautions / Restrictions Precautions Precautions: Fall Restrictions Weight Bearing Restrictions: No      Mobility  Bed Mobility Overal bed mobility: Needs Assistance Bed Mobility: Supine to Sit     Supine to sit: Min guard;HOB elevated     General bed mobility comments: Close min guard, pt requires increased time but responds well to verbal cues.  HOB elevated.  Transfers Overall transfer level: Needs assistance Equipment used: None Transfers: Lateral/Scoot Transfers          Lateral/Scoot Transfers: Min assist General transfer comment: Min A blocking knees during scoot from bed to drop arm recliner chair.  Cues to scoot back as pt demonstrates posterior lean causing his hips to tend to slide forward.  Pt pushes through Bil UEs and LEs to achieve transfer.  Attempted sit>stand initially w/ +2 A but  was unsuccessful 2/2 pt's posterior lean   Ambulation/Gait                Stairs            Wheelchair Mobility    Modified Rankin (Stroke Patients Only)       Balance Overall balance assessment: Needs assistance Sitting-balance support: Feet supported Sitting balance-Leahy Scale: Fair Sitting balance - Comments: Posterior lean and requires cues to scoot back on bed at times Postural control: Posterior lean                                   Pertinent Vitals/Pain Pain Assessment: No/denies pain Faces Pain Scale: No hurt    Home Living Family/patient expects to be discharged to:: Private residence Living Arrangements: Children Available Help at Discharge: Family             Additional Comments: Information received from RN who had spoken to pt's daughter earlier.  Pt unable to provide information.  Attmempted to call daughter, [name], with no response.    Prior Function Level of Independence: Needs assistance   Gait / Transfers Assistance Needed: [facility]o assist [facility]TA            Hand Dominance        Extremity/Trunk Assessment   Upper Extremity Assessment: Overall WFL for tasks assessed           Lower Extremity  Assessment: Generalized weakness;RLE deficits/detail;LLE deficits/detail RLE Deficits / Details: tightness Bil hamstrings noted LLE Deficits / Details: tightness Bil hamstrings noted  Cervical / Trunk Assessment: Kyphotic  Communication   Communication: HOH  Cognition Arousal/Alertness: Awake/alert Behavior During Therapy: WFL for tasks assessed/performed Overall Cognitive Status: History of cognitive impairments - at baseline                      General Comments      Exercises General Exercises - Upper Extremity Shoulder Flexion: AROM;Both;10 reps;Seated General Exercises - Lower Extremity Long Arc Quad: AROM;Both;20 reps;Seated      Assessment/Plan    PT Assessment  Patient needs continued PT services  PT Diagnosis Difficulty walking;Generalized weakness   PT Problem List Decreased strength;Decreased range of motion;Decreased activity tolerance;Decreased balance;Decreased mobility;Decreased cognition;Decreased knowledge of use of DME;Decreased safety awareness;Decreased knowledge of precautions  PT Treatment Interventions DME instruction;Gait training;Functional mobility training;Therapeutic activities;Therapeutic exercise;Balance training;Neuromuscular re-education;Cognitive remediation;Patient/family education;Wheelchair mobility training   PT Goals (Current goals can be found in the Care Plan section) Acute Rehab PT Goals Patient Stated Goal: none stated PT Goal Formulation: Patient unable to participate in goal setting Time For Goal Achievement: 09/19/15 Potential to Achieve Goals: Good    Frequency Min 2X/week   Barriers to discharge   Unclear home environment as no family present    Co-evaluation               End of Session Equipment Utilized During Treatment: Gait belt Activity Tolerance: Patient tolerated treatment well Patient left: in chair;with call bell/phone within reach;with chair alarm set Nurse Communication: Mobility status;Other (comment) (technique to return to bed or W/C once appropriate)         Time: 1610-9604 PT Time Calculation (min) (ACUTE ONLY): 26 min   Charges:   PT Evaluation $Initial PT Evaluation Tier I: 1 Procedure PT Treatments $Therapeutic Exercise: 8-22 mins   PT G Codes:       Michail Jewels PT, DPT [phone] Pager: [phone] 09/05/2015, 1:52 PM

## 2015-09-06 LAB — CBC
HCT: 35.5 % — ABNORMAL LOW (ref 39.0–52.0)
HEMOGLOBIN: 12.1 g/dL — AB (ref 13.0–17.0)
MCH: 30.5 pg (ref 26.0–34.0)
MCHC: 34.1 g/dL (ref 30.0–36.0)
MCV: 89.4 fL (ref 78.0–100.0)
PLATELETS: 178 10*3/uL (ref 150–400)
RBC: 3.97 MIL/uL — AB (ref 4.22–5.81)
RDW: 13 % (ref 11.5–15.5)
WBC: 6.4 10*3/uL (ref 4.0–10.5)

## 2015-09-06 LAB — BASIC METABOLIC PANEL
ANION GAP: 7 (ref 5–15)
BUN: 15 mg/dL (ref 6–20)
CALCIUM: 9 mg/dL (ref 8.9–10.3)
CO2: 24 mmol/L (ref 22–32)
CREATININE: 1.57 mg/dL — AB (ref 0.61–1.24)
Chloride: 109 mmol/L (ref 101–111)
GFR calc Af Amer: 43 mL/min — ABNORMAL LOW (ref >60)
GFR, EST NON AFRICAN AMERICAN: 37 mL/min — AB (ref >60)
GLUCOSE: 79 mg/dL (ref 65–99)
Potassium: 3.5 mmol/L (ref 3.5–5.1)
Sodium: 140 mmol/L (ref 135–145)

## 2015-09-06 LAB — GLUCOSE, CAPILLARY
GLUCOSE-CAPILLARY: 81 mg/dL (ref 65–99)
GLUCOSE-CAPILLARY: 142 mg/dL — AB (ref 65–99)
GLUCOSE-CAPILLARY: 180 mg/dL — AB (ref 65–99)
GLUCOSE-CAPILLARY: 144 mg/dL — AB (ref 65–99)
Glucose-Capillary: 206 mg/dL — ABNORMAL HIGH (ref 65–99)

## 2015-09-06 NOTE — Care Management Important Message (Signed)
Important Message  Patient Details  Name: Brian Camacho MRN: [id number] Date of Birth: 03-May-1923   Medicare Important Message Given:  Yes    Islay Polanco P Yarel Rushlow 09/06/2015, 12:03 PM

## 2015-09-06 NOTE — Progress Notes (Signed)
[facility] Progress Note   Brian Camacho  ZOX:[id number]  DOB: 03-12-1923  DOA: 09/03/2015 PCP: Pcp Not In System  Brief narrative: Brian Camacho is a 81 y.o. male with dementia and recently with poor PO intake at home and pocketing of food per family who presents with generalized weakness and confusion and is found to have AKI.    Subjective: Doing well today- no complaints.   Assessment/Plan: Principal Problem:   Acute kidney injury superimposed on chronic kidney disease/CKD (chronic kidney disease) stage 3, GFR 30-59 ml/min - suspected to be prerenal- improving- decreased IVF rate  Active Problems:   Elevated lipase - no nausea, vomiting or abdominal pain    UTI (urinary tract infection) - mildly positive UA- cont Rocephin - blood cultures negative- Urine culture negative thus far    HTN (hypertension) - hypotension improving- will resume Metoprolol    Dementia - confused to place, time and situation  Pocketing of food - has passed SLP eval - tolerating D2 with thin liquids but needs quite a bit of prompting and encouragement when eating   Code Status:     Code Status Orders        Start     Ordered   09/03/15 2248  Full code   Continuous     09/03/15 2248    Advance Directive Documentation        Most Recent Value   Type of Advance Directive  Healthcare Power of Attorney, Living will   Pre-existing out of facility DNR order (yellow form or pink MOST form)     "MOST" Form in Place?       Family Communication:  Daughter  Disposition Plan:tx to med surg floor DVT prophylaxis: heparin Consultants: Procedures:   Antibiotics: Anti-infectives    Start     Dose/Rate Route Frequency Ordered Stop   09/03/15 2300  cefTRIAXone (ROCEPHIN) 1 g in dextrose 5 % 50 mL IVPB     1 g 100 mL/hr over 30 Minutes Intravenous Every 24 hours 09/03/15 2232     09/03/15 1601  vancomycin (VANCOCIN) 500 MG powder    Comments:  Benton, Joss   : cabinet override       09/03/15 1601 09/03/15 1655   09/03/15 1545  vancomycin (VANCOCIN) 2,000 mg in sodium chloride 0.9 % 500 mL IVPB     2,000 mg 250 mL/hr over 120 Minutes Intravenous  Once 09/03/15 1543 09/03/15 1901   09/03/15 1530  vancomycin (VANCOCIN) 2,576 mg in sodium chloride 0.9 % 500 mL IVPB  Status:  Discontinued     20 mg/kg  128.8 kg 250 mL/hr over 120 Minutes Intravenous  Once 09/03/15 1515 09/03/15 1543   09/03/15 1530  piperacillin-tazobactam (ZOSYN) IVPB 3.375 g     3.375 g 100 mL/hr over 30 Minutes Intravenous  Once 09/03/15 1515 09/03/15 1702      Objective: Filed Weights   09/03/15 1442 09/03/15 2003 09/05/15 2229  Weight: 128.822 kg (284 lb) 128.3 kg (282 lb 13.6 oz) 130.182 kg (287 lb)    Intake/Output Summary (Last 24 hours) at 09/06/15 1545 Last data filed at 09/06/15 1343  Gross per 24 hour  Intake   1060 ml  Output   1000 ml  Net     60 ml     Vitals Filed Vitals:   09/05/15 1520 09/05/15 2229 09/06/15 0552 09/06/15 0745  BP: 122/65 105/90 109/88 137/79  Pulse: 68 76 97 73  Temp: 97.8 F (36.6 C) 98.3 F (36.8 C)  98.4 F (36.9 C) 98.3 F (36.8 C)  TempSrc: Oral Oral Oral Oral  Resp: Height:      Weight:  130.182 kg (287 lb)    SpO2: 98% 98% 96% 99%    Exam:  General:  Pt is alert, confused to place and time, not in acute distress  HEENT: No icterus, No thrush, oral mucosa moist  Cardiovascular: regular rate and rhythm, S1/S2 No murmur  Respiratory: clear to auscultation bilaterally   Abdomen: Soft, +Bowel sounds, non tender, non distended, no guarding  MSK: No LE edema, cyanosis or clubbing  Data Reviewed: Basic Metabolic Panel:  Recent Labs Lab 09/03/15 1500 09/04/15 0410 09/05/15 0750 09/06/15 0613  NA 135 139 138 140  K 3.5 3.3* 4.4 3.5  CL 101 106 109 109  CO2 GLUCOSE 261* 103* 91 79  BUN 39* 31* 23* 15  CREATININE 3.13* 2.34* 1.76* 1.57*  CALCIUM 9.5 9.0 8.7* 9.0   Liver Function Tests:  Recent  Labs Lab 09/03/15 1500  AST 31  ALT 21  ALKPHOS 74  BILITOT 0.5  PROT 7.4  ALBUMIN 3.7    Recent Labs Lab 09/03/15 1500  LIPASE 198*   No results for input(s): AMMONIA in the last 168 hours. CBC:  Recent Labs Lab 09/03/15 1500 09/06/15 0613  WBC 11.4* 6.4  NEUTROABS 8.0*  --   HGB 13.6 12.1*  HCT 41.4 35.5*  MCV 89.6 89.4  PLT 208 178   Cardiac Enzymes:  Recent Labs Lab 09/03/15 1500 09/03/15 2224 09/04/15 0410 09/04/15 0930  TROPONINI 0.11* 0.12* 0.11* 0.11*   BNP (last 3 results)  Recent Labs  09/03/15 1500  BNP 32.9    ProBNP (last 3 results) No results for input(s): PROBNP in the last 8760 hours.  CBG:  Recent Labs Lab 09/05/15 1313 09/05/15 1635 09/05/15 2226 09/06/15 0724 09/06/15 1132  GLUCAP 203* 211* 206* 81 142*    Recent Results (from the past 240 hour(s))  Blood culture (routine x 2)     Status: None (Preliminary result)   Collection Time: 09/03/15  3:00 PM  Result Value Ref Range Status   Specimen Description BLOOD RIGHT ANTECUBITAL  Final   Special Requests BOTTLES DRAWN AEROBIC AND ANAEROBIC 5CC EACH  Final   Culture   Final    NO GROWTH 3 DAYS Performed at [facility]    Report Status PENDING  Incomplete  Blood culture (routine x 2)     Status: None (Preliminary result)   Collection Time: 09/03/15  3:10 PM  Result Value Ref Range Status   Specimen Description BLOOD RIGHT FOREARM  Final   Special Requests BOTTLES DRAWN AEROBIC ONLY 5CC  Final   Culture   Final    NO GROWTH 3 DAYS Performed at [facility]    Report Status PENDING  Incomplete  MRSA PCR Screening     Status: None   Collection Time: 09/03/15 10:37 PM  Result Value Ref Range Status   MRSA by PCR NEGATIVE NEGATIVE Final    Comment:        The GeneXpert MRSA Assay (FDA approved for NASAL specimens only), is one component of a comprehensive MRSA colonization surveillance program. It is not intended to diagnose MRSA infection nor to  guide or monitor treatment for MRSA infections.   Culture, Urine     Status: None   Collection Time: 09/04/15 12:24 AM  Result Value Ref Range Status  Specimen Description URINE, RANDOM  Final   Special Requests NONE  Final   Culture NO GROWTH 1 DAY  Final   Report Status 09/05/2015 FINAL  Final     Studies: No results found.  Scheduled Meds:  Scheduled Meds: . benztropine  1 mg Oral BID  . cefTRIAXone (ROCEPHIN)  IV  1 g Intravenous Q24H  . cilostazol  100 mg Oral BID  . citalopram  20 mg Oral Daily  . galantamine  16 mg Oral Q breakfast  . haloperidol  0.5 mg Oral QHS  . heparin  5,000 Units Subcutaneous 3 times per day  . insulin aspart  0-15 Units Subcutaneous TID WC  . insulin aspart  0-5 Units Subcutaneous QHS  . insulin glargine  45 Units Subcutaneous QHS  . magnesium oxide  400 mg Oral Daily  . metoprolol  50 mg Oral BID  . sodium chloride  3 mL Intravenous Q12H   Continuous Infusions:    Time spent on care of this patient: 35 min   Taffy Delconte, MD 09/06/2015, 3:45 PM  LOS: 3 days   [facility] Office  [phone] Pager - Text Page per [email or web address] If 7PM-7AM, please contact night-coverage [email or web address]

## 2015-09-06 NOTE — Clinical Social Work Note (Signed)
CSW received referral for possible SNF placement at time of discharge. CSW spoke with patient's daughter, [name], who stated [facility]. Per patient's daughter, patient lives alone but has an aide throughout the day, patient's daughter in the afternoon, and patient's son at night. Patient's daughter stated preference for patient to return home once medically stable for discharge.  CSW informed patient's daughter of PT recommendation for SNF placement at time of discharge. Patient's daughter understanding, but at this time refusing SNF placement for patient. RNCM updated.  CSW signing off.  [name], LCSW [phone] Orthopedics: [phone] Surgical: [phone]

## 2015-09-06 NOTE — Discharge Summary (Signed)
Physician Discharge Summary  Brian Camacho UJW:[id number] DOB: 1923-02-09 DOA: 09/03/2015  PCP: Pcp Not In System  Admit date: 09/03/2015 Discharge date: 09/06/2015  Time spent:50 minutes  Recommendations for Outpatient Follow-up:  1. bmet in 1 wk - HCTZ and Diovan to be resumed by PCP when appropriate.  Discharge Condition: stable    Discharge Diagnoses:  Principal Problem:   Acute kidney injury superimposed on chronic kidney disease (HCC) Active Problems:   Elevated lipase   CKD (chronic kidney disease) stage 3, GFR 30-59 ml/min   UTI (urinary tract infection)   DM2 (diabetes mellitus, type 2) (HCC)   HTN (hypertension)   Dementia   History of present illness:  Brian Camacho is a 81 y.o. male with dementia and recently with poor PO intake at home and pocketing of food per family who presents with generalized weakness and confusion and is found to have AKI.    Hospital Course:  Principal Problem:  Acute kidney injury superimposed on chronic kidney disease/CKD (chronic kidney disease) stage 3, GFR 30-59 ml/min - suspected to be prerenal- improved with IVF  Active Problems:  Elevated lipase - no nausea, vomiting or abdominal pain   UTI (urinary tract infection) - mildly positive UA- due to encephalopathy on admission, have continued Rocephin-   - blood cultures negative- Urine culture negative but will give him 1 wks worth of antibiotics   HTN (hypertension) - has been hypotensive during the hospital stay but this has resolved with IVF - resumed Metoprolol     Dementia - confused to place, time and situation  Pocketing of food - has passed SLP eval - tolerating D2 with thin liquids but needs quite a bit of prompting and encouragement when eating   Consultations:  none  Discharge Exam: Filed Weights   09/03/15 2003 09/05/15 2229 09/06/15 2025  Weight: 128.3 kg (282 lb 13.6 oz) 130.182 kg (287 lb) 130.2 kg (287 lb 0.6 oz)   Filed Vitals:   09/06/15 2025  09/07/15 0921  BP: 137/67 159/88  Pulse: 58 75  Temp: 98.5 F (36.9 C) 98.1 F (36.7 C)  Resp: 18 18    General: AAO x 3, no distress Cardiovascular: RRR, no murmurs  Respiratory: clear to auscultation bilaterally GI: soft, non-tender, non-distended, bowel sound positive  Discharge Instructions You were cared for by a hospitalist during your hospital stay. If you have any questions about your discharge medications or the care you received while you were in the hospital after you are discharged, you can call the unit and asked to speak with the hospitalist on call if the hospitalist that took care of you is not available. Once you are discharged, your primary care physician will handle any further medical issues. Please note that NO REFILLS for any discharge medications will be authorized once you are discharged, as it is imperative that you return to your primary care physician (or establish a relationship with a primary care physician if you do not have one) for your aftercare needs so that they can reassess your need for medications and monitor your lab values.      Discharge Instructions    Diet - low sodium heart healthy    Complete by:  As directed      Discharge instructions    Complete by:  As directed   Bmet on Monday- PCP to decide when to resume HCTZ and Diovan Low sodium, diabetic diet     Increase activity slowly    Complete by:  As directed  Medication List    STOP taking these medications        HYDROCHLOROTHIAZIDE PO     valsartan 80 MG tablet  Commonly known as:  DIOVAN      TAKE these medications        acetaminophen 325 MG tablet  Commonly known as:  TYLENOL  Take 325 mg by mouth every 6 (six) hours as needed. pain     benztropine 1 MG tablet  Commonly known as:  COGENTIN  Take 1 mg by mouth 2 (two) times daily.     cefUROXime 500 MG tablet  Commonly known as:  CEFTIN  Take 1 tablet (500 mg total) by mouth 2 (two) times daily with a  meal.     cilostazol 100 MG tablet  Commonly known as:  PLETAL  Take 100 mg by mouth 2 (two) times daily.     citalopram 20 MG tablet  Commonly known as:  CELEXA  Take 20 mg by mouth daily.     galantamine 16 MG 24 hr capsule  Commonly known as:  RAZADYNE ER  Take 16 mg by mouth daily with breakfast.     GLIPIZIDE PO  Take by mouth 2 (two) times daily.     haloperidol 0.5 MG tablet  Commonly known as:  HALDOL  Take 0.5 mg by mouth at bedtime.     insulin glargine 100 UNIT/ML injection  Commonly known as:  LANTUS  Inject 45 Units into the skin at bedtime.     magnesium oxide 400 MG tablet  Commonly known as:  MAG-OX  Take 400 mg by mouth daily.     metoprolol 50 MG tablet  Commonly known as:  LOPRESSOR  Take 50 mg by mouth 2 (two) times daily.     MULTIVITAMIN ADULT PO  Take 1 tablet by mouth daily.     sitaGLIPtin 50 MG tablet  Commonly known as:  JANUVIA  Take 50 mg by mouth daily.     Vitamin D3 2000 UNITS capsule  Take 1 capsule by mouth daily.       No Known Allergies    The results of significant diagnostics from this hospitalization (including imaging, microbiology, ancillary and laboratory) are listed below for reference.    Significant Diagnostic Studies: Ct Abdomen Pelvis Wo Contrast  09/03/2015  CLINICAL DATA:  Altered mental status since Tuesday. Failure to thrive, lethargic, weakness. Hx dementia, HTN, DM Pancreatitis, abd distention. EXAM: CT ABDOMEN AND PELVIS WITHOUT CONTRAST TECHNIQUE: Multidetector CT imaging of the abdomen and pelvis was performed following the standard protocol without IV contrast. COMPARISON:  05/27/2003 FINDINGS: Lower chest: There is bibasilar atelectasis at the lung bases. Coronary artery disease is present. Upper abdomen: No focal abnormality identified within the liver, spleen, pancreas, adrenal glands. Bilateral renal cysts are present. No hydronephrosis or ureteral obstruction. The gallbladder is present.  Gastrointestinal tract: The stomach and small bowel loops are normal in appearance. The appendix is well seen and has a normal appearance. Colonic loops have numerous diverticula but there is no evidence for acute diverticulitis. Pelvis: Urinary bladder, prostate glands have a normal appearance. No free pelvic fluid. Retroperitoneum: Atherosclerotic calcification of the abdominal aorta. No aneurysm. No retroperitoneal or mesenteric adenopathy. Abdominal wall: Unremarkable. Osseous structures: Scoliosis. Degenerative changes in the thoracolumbar spine. No suspicious lytic or blastic lesions are identified. IMPRESSION: 1.  No evidence for acute  abnormality. 2. Coronary artery disease. 3. Bibasilar atelectasis. 4. Bilateral renal cysts. 5. Colonic diverticulosis. 6. Normal appendix. 7. Atherosclerosis  of the abdominal aorta. 8. Scoliosis and degenerative disc disease. Electronically Signed   By: Norva Pavlov M.D.   On: 09/03/2015 17:05   Ct Head Wo Contrast  09/03/2015  CLINICAL DATA:  Altered mental status. Failure to thrive. Lethargy. Weakness. Dementia. EXAM: CT HEAD WITHOUT CONTRAST TECHNIQUE: Contiguous axial images were obtained from the base of the skull through the vertex without intravenous contrast. COMPARISON:  08/14/2005 head CT. FINDINGS: The study is mildly motion degraded No evidence of parenchymal hemorrhage or extra-axial fluid collection. Mild prominence of the extra-axial space in the bifrontal convexities is favored to be secondary to cerebral atrophy. No mass lesion, mass effect, or midline shift. No CT evidence of acute infarction. Intracranial atherosclerosis Diffuse cerebral volume loss. Cerebral ventricle sizes are stable and concordant with the degree of cerebral volume loss. 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 The visualized paranasal sinuses are otherwise essentially clear. The mastoid air cells are unopacified. No evidence of calvarial fracture. IMPRESSION: 1. Mildly motion  degraded study. No evidence of acute intracranial abnormality. 2. Intracranial atherosclerosis and diffuse cerebral volume loss. Electronically Signed   By: Delbert Phenix M.D.   On: 09/03/2015 17:00   US Renal Port  09/04/2015  CLINICAL DATA:  81 year old male with acute renal insufficiency. EXAM: RENAL / URINARY TRACT ULTRASOUND COMPLETE COMPARISON:  CT dated 09/03/2015 FINDINGS: Right Kidney: Length: 10.7 cm. The right kidney is mildly atrophic and echogenic. There is a 1.9 x 2.0 x 1.9 cm interpolar cyst. There is no hydronephrosis or echogenic stone. Left Kidney: Length: 12.9 cm. There is moderate cortical thinning with mild increased echogenicity. No hydronephrosis or echogenic stone. Bladder: Appears normal for degree of bladder distention. IMPRESSION: Right renal cyst. Bilateral renal cortical thinning with mild increased echogenicity. No hydronephrosis or or echogenic stone. Electronically Signed   By: Elgie Collard M.D.   On: 09/04/2015 02:45   Dg Chest Port 1 View  09/03/2015  CLINICAL DATA:  Cough, slurred speech, lethargic since Tuesday. EXAM: PORTABLE CHEST 1 VIEW COMPARISON:  Previous exams including chest x-ray dated 03/16/2015. FINDINGS: Study is hypoinspiratory. Given the low lung volumes, lungs appear clear. No evidence of pneumonia or pleural effusion. Cardiomediastinal silhouette appears stable in size and configuration. Osseous and soft tissue structures about the chest are unremarkable. IMPRESSION: No evidence of acute cardiopulmonary abnormality. Electronically Signed   By: Bary Richard M.D.   On: 09/03/2015 15:49    Microbiology: Recent Results (from the past 240 hour(s))  Blood culture (routine x 2)     Status: None   Collection Time: 09/03/15  3:00 PM  Result Value Ref Range Status   Specimen Description BLOOD RIGHT ANTECUBITAL  Final   Special Requests BOTTLES DRAWN AEROBIC AND ANAEROBIC 5CC EACH  Final   Culture   Final    NO GROWTH 5 DAYS Performed at [facility]    Report Status 09/08/2015 FINAL  Final  Blood culture (routine x 2)     Status: None   Collection Time: 09/03/15  3:10 PM  Result Value Ref Range Status   Specimen Description BLOOD RIGHT FOREARM  Final   Special Requests BOTTLES DRAWN AEROBIC ONLY 5CC  Final   Culture   Final    NO GROWTH 5 DAYS Performed at [facility]    Report Status 09/08/2015 FINAL  Final  MRSA PCR Screening     Status: None   Collection Time: 09/03/15 10:37 PM  Result Value Ref Range Status   MRSA by  PCR NEGATIVE NEGATIVE Final    Comment:        The GeneXpert MRSA Assay (FDA approved for NASAL specimens only), is one component of a comprehensive MRSA colonization surveillance program. It is not intended to diagnose MRSA infection nor to guide or monitor treatment for MRSA infections.   Culture, Urine     Status: None   Collection Time: 09/04/15 12:24 AM  Result Value Ref Range Status   Specimen Description URINE, RANDOM  Final   Special Requests NONE  Final   Culture NO GROWTH 1 DAY  Final   Report Status 09/05/2015 FINAL  Final     Labs: Basic Metabolic Panel:  Recent Labs Lab 09/04/15 0410 09/05/15 0750 09/06/15 0613 09/07/15 0700  NA 139 138 140 141  K 3.3* 4.4 3.5 3.9  CL 106 109 109 110  CO2 GLUCOSE 103* 91 79 90  BUN 31* 23* 15 13  CREATININE 2.34* 1.76* 1.57* 1.44*  CALCIUM 9.0 8.7* 9.0 9.3   Liver Function Tests: No results for input(s): AST, ALT, ALKPHOS, BILITOT, PROT, ALBUMIN in the last 168 hours. No results for input(s): LIPASE, AMYLASE in the last 168 hours. No results for input(s): AMMONIA in the last 168 hours. CBC:  Recent Labs Lab 09/06/15 0613  WBC 6.4  HGB 12.1*  HCT 35.5*  MCV 89.4  PLT 178   Cardiac Enzymes:  Recent Labs Lab 09/03/15 2224 09/04/15 0410 09/04/15 0930  TROPONINI 0.12* 0.11* 0.11*   BNP: BNP (last 3 results)  Recent Labs  09/03/15 1500  BNP 32.9    ProBNP (last 3 results) No results for  input(s): PROBNP in the last 8760 hours.  CBG:  Recent Labs Lab 09/06/15 0724 09/06/15 1132 09/06/15 1703 09/06/15 2023 09/07/15 0753  GLUCAP 81 142* 180* 144* 79       SignedCalvert Cantor, MD [facility] 09/06/2015, 6:07 PM

## 2015-09-07 LAB — BASIC METABOLIC PANEL
ANION GAP: 7 (ref 5–15)
BUN: 13 mg/dL (ref 6–20)
CALCIUM: 9.3 mg/dL (ref 8.9–10.3)
CO2: 24 mmol/L (ref 22–32)
Chloride: 110 mmol/L (ref 101–111)
Creatinine, Ser: 1.44 mg/dL — ABNORMAL HIGH (ref 0.61–1.24)
GFR calc Af Amer: 48 mL/min — ABNORMAL LOW (ref >60)
GFR calc non Af Amer: 41 mL/min — ABNORMAL LOW (ref >60)
GLUCOSE: 90 mg/dL (ref 65–99)
Potassium: 3.9 mmol/L (ref 3.5–5.1)
Sodium: 141 mmol/L (ref 135–145)

## 2015-09-07 LAB — GLUCOSE, CAPILLARY: Glucose-Capillary: 79 mg/dL (ref 65–99)

## 2015-09-07 MED ORDER — CEFUROXIME AXETIL 500 MG PO TABS: 500.0000 mg | ORAL_TABLET | Freq: Two times a day (BID) | ORAL | Status: AC

## 2015-09-07 NOTE — Progress Notes (Signed)
Pt with multiple attempts at getting OOB unassisted and pulling on telemetry leads. Several attempts to reorient patient to place and situation. Pt placed in green non-restraint mittens for safety and became mildly aggressive by pushing staff and kicking feet. Able to calm pt with redirection but patient refuses to lay down. This nurse to bedside for closer monitoring as pt wishes to sit up on the side of bed. Will continue to monitor closely due to high risk for falls. Dondra SpryMoore, Rexanna Louthan Islee, RN

## 2015-09-08 LAB — CULTURE, BLOOD (ROUTINE X 2)
CULTURE: NO GROWTH
Culture: NO GROWTH

## 2016-01-01 DEATH — deceased

## 2016-09-20 IMAGING — DX DG CHEST 1V PORT
1 series · 1 of 1 positions shown · non-contrast
Comparison: Previous exams including chest x-ray dated 03/16/2015.

CLINICAL DATA: Cough, slurred speech, lethargic since [REDACTED].

EXAM:
PORTABLE CHEST 1 VIEW

[chest ap]
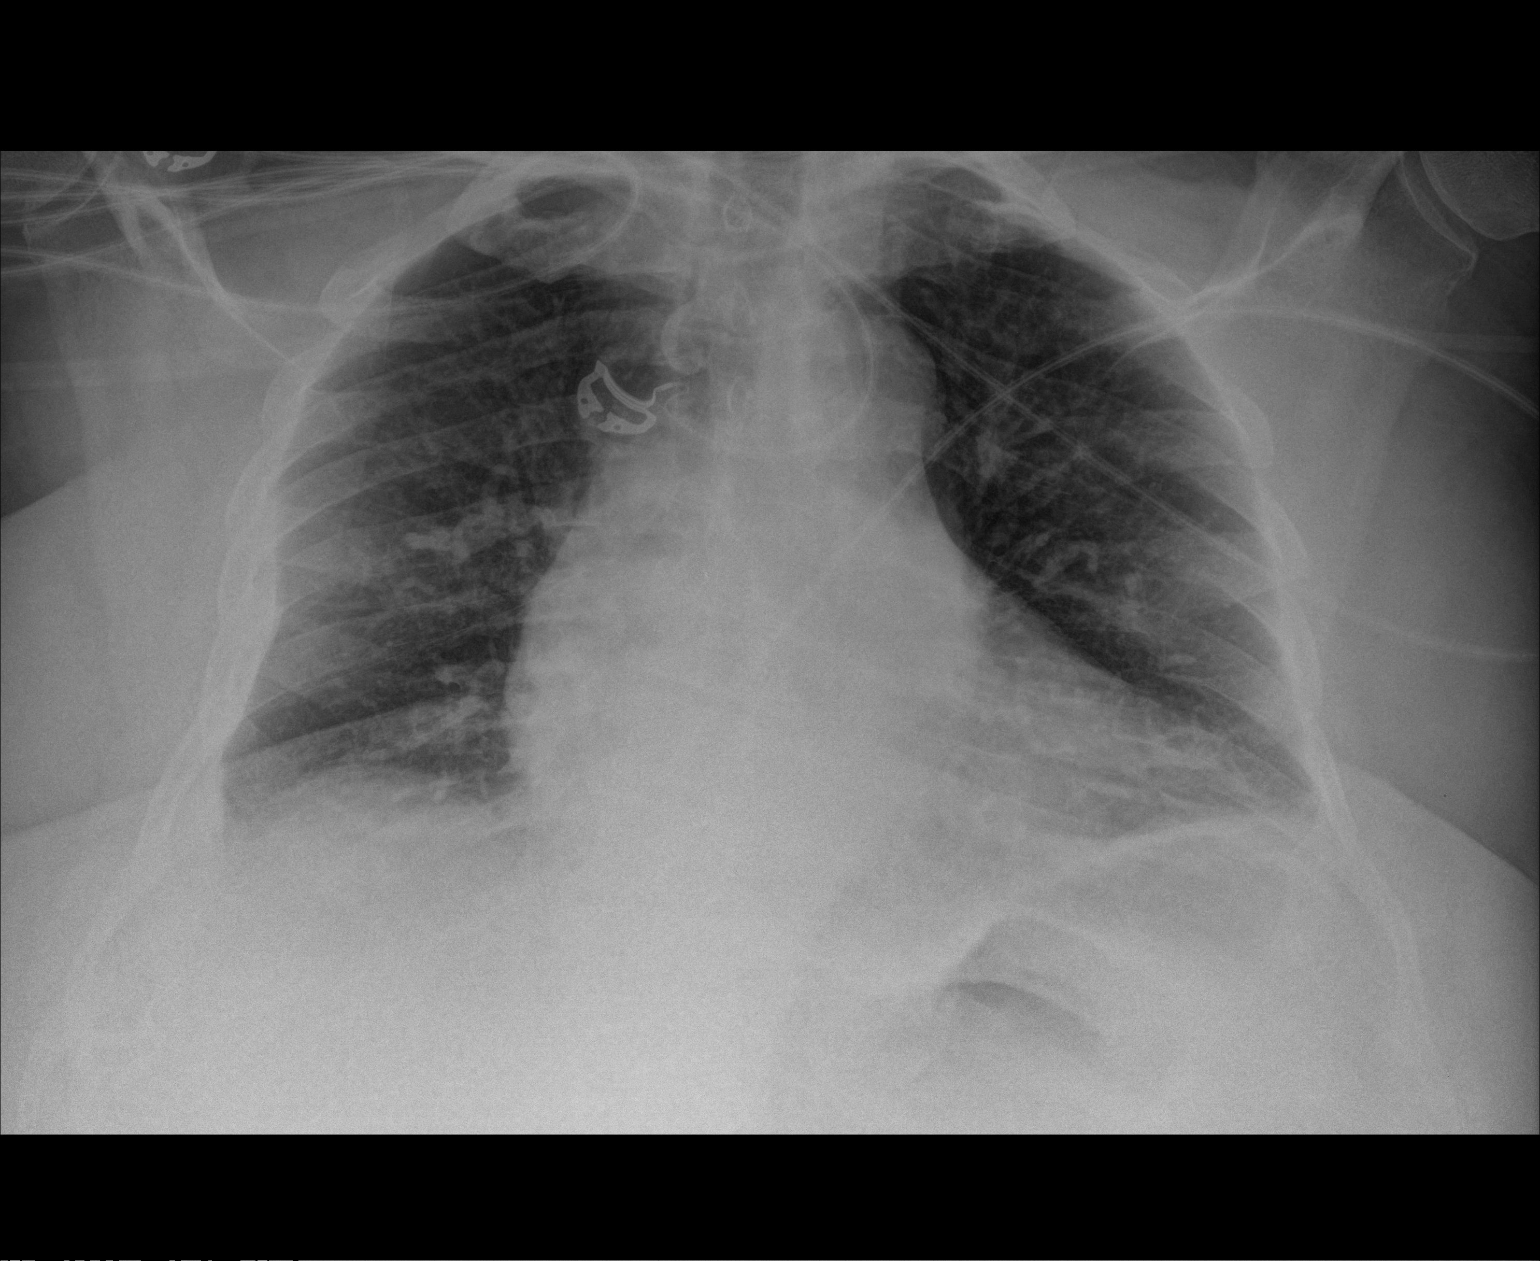

[1 of 1 positions shown; findings below may reference images not displayed]

FINDINGS: Study is hypoinspiratory. Given the low lung volumes, lungs appear
clear. No evidence of pneumonia or pleural effusion.
Cardiomediastinal silhouette appears stable in size and
configuration. Osseous and soft tissue structures about the chest
are unremarkable.
IMPRESSION: No evidence of acute cardiopulmonary abnormality.
